# Patient Record
Sex: Female | Born: 1995 | Race: Black or African American | Hispanic: No | Marital: Married | Smoking: Former smoker
Health system: Southern US, Community
[De-identification: ages and names within clinical notes are randomized; demographics above are authoritative.]

## PROBLEM LIST (undated history)

## (undated) ENCOUNTER — Inpatient Hospital Stay (HOSPITAL_COMMUNITY): Payer: BC Managed Care – PPO

## (undated) DIAGNOSIS — N83209 Unspecified ovarian cyst, unspecified side: Secondary | ICD-10-CM

## (undated) DIAGNOSIS — N809 Endometriosis, unspecified: Secondary | ICD-10-CM

## (undated) DIAGNOSIS — J309 Allergic rhinitis, unspecified: Secondary | ICD-10-CM

## (undated) DIAGNOSIS — L309 Dermatitis, unspecified: Secondary | ICD-10-CM

## (undated) HISTORY — DX: Dermatitis, unspecified: L30.9

## (undated) HISTORY — DX: Allergic rhinitis, unspecified: J30.9

## (undated) HISTORY — DX: Unspecified ovarian cyst, unspecified side: N83.209

## (undated) HISTORY — PX: WISDOM TOOTH EXTRACTION: SHX21

---
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
## 2015-05-08 ENCOUNTER — Other Ambulatory Visit: Payer: Self-pay | Admitting: Obstetrics and Gynecology

## 2015-05-08 DIAGNOSIS — R1031 Right lower quadrant pain: Secondary | ICD-10-CM

## 2015-05-13 ENCOUNTER — Other Ambulatory Visit: Payer: Self-pay

## 2015-05-18 ENCOUNTER — Encounter (HOSPITAL_BASED_OUTPATIENT_CLINIC_OR_DEPARTMENT_OTHER): Payer: Self-pay

## 2015-05-18 ENCOUNTER — Emergency Department (HOSPITAL_BASED_OUTPATIENT_CLINIC_OR_DEPARTMENT_OTHER): Payer: BLUE CROSS/BLUE SHIELD

## 2015-05-18 ENCOUNTER — Emergency Department (HOSPITAL_BASED_OUTPATIENT_CLINIC_OR_DEPARTMENT_OTHER)
Admission: EM | Admit: 2015-05-18 | Discharge: 2015-05-18 | Disposition: A | Discharge status: Home / Self Care | Payer: BLUE CROSS/BLUE SHIELD | Attending: Emergency Medicine | Admitting: Emergency Medicine

## 2015-05-18 DIAGNOSIS — Z3202 Encounter for pregnancy test, result negative: Secondary | ICD-10-CM | POA: Insufficient documentation

## 2015-05-18 DIAGNOSIS — Z8742 Personal history of other diseases of the female genital tract: Secondary | ICD-10-CM | POA: Insufficient documentation

## 2015-05-18 DIAGNOSIS — R1031 Right lower quadrant pain: Secondary | ICD-10-CM | POA: Insufficient documentation

## 2015-05-18 DIAGNOSIS — R112 Nausea with vomiting, unspecified: Secondary | ICD-10-CM | POA: Diagnosis not present

## 2015-05-18 HISTORY — DX: Endometriosis, unspecified: N80.9

## 2015-05-18 LAB — CBG MONITORING, ED: GLUCOSE-CAPILLARY: 81 mg/dL (ref 65–99)

## 2015-05-18 LAB — COMPREHENSIVE METABOLIC PANEL
ALT: 15 U/L (ref 14–54)
AST: 21 U/L (ref 15–41)
Albumin: 3.9 g/dL (ref 3.5–5.0)
Alkaline Phosphatase: 62 U/L (ref 38–126)
Anion gap: 6 (ref 5–15)
BILIRUBIN TOTAL: 0.7 mg/dL (ref 0.3–1.2)
BUN: 13 mg/dL (ref 6–20)
CO2: 24 mmol/L (ref 22–32)
CREATININE: 0.82 mg/dL (ref 0.44–1.00)
Calcium: 9 mg/dL (ref 8.9–10.3)
Chloride: 107 mmol/L (ref 101–111)
GFR calc Af Amer: >60 mL/min (ref >60)
GFR calc non Af Amer: >60 mL/min (ref >60)
GLUCOSE: 92 mg/dL (ref 65–99)
POTASSIUM: 3.5 mmol/L (ref 3.5–5.1)
SODIUM: 137 mmol/L (ref 135–145)
TOTAL PROTEIN: 7.5 g/dL (ref 6.5–8.1)

## 2015-05-18 LAB — CBC WITH DIFFERENTIAL/PLATELET
BASOS PCT: 0 % (ref 0–1)
Basophils Absolute: 0 10*3/uL (ref 0.0–0.1)
EOS ABS: 0.1 10*3/uL (ref 0.0–0.7)
EOS PCT: 1 % (ref 0–5)
HCT: 39.3 % (ref 36.0–46.0)
HEMOGLOBIN: 13 g/dL (ref 12.0–15.0)
LYMPHS ABS: 2 10*3/uL (ref 0.7–4.0)
Lymphocytes Relative: 36 % (ref 12–46)
MCH: 27.8 pg (ref 26.0–34.0)
MCHC: 33.1 g/dL (ref 30.0–36.0)
MCV: 84.2 fL (ref 78.0–100.0)
Monocytes Absolute: 0.4 10*3/uL (ref 0.1–1.0)
Monocytes Relative: 8 % (ref 3–12)
NEUTROS ABS: 3 10*3/uL (ref 1.7–7.7)
NEUTROS PCT: 55 % (ref 43–77)
PLATELETS: 207 10*3/uL (ref 150–400)
RBC: 4.67 MIL/uL (ref 3.87–5.11)
RDW: 12.7 % (ref 11.5–15.5)
WBC: 5.5 10*3/uL (ref 4.0–10.5)

## 2015-05-18 LAB — URINALYSIS, ROUTINE W REFLEX MICROSCOPIC
BILIRUBIN URINE: NEGATIVE
Glucose, UA: NEGATIVE mg/dL
HGB URINE DIPSTICK: NEGATIVE
Ketones, ur: NEGATIVE mg/dL
Leukocytes, UA: NEGATIVE
NITRITE: NEGATIVE
Protein, ur: NEGATIVE mg/dL
Specific Gravity, Urine: 1.029 (ref 1.005–1.030)
Urobilinogen, UA: 0.2 mg/dL (ref 0.0–1.0)
pH: 6 (ref 5.0–8.0)

## 2015-05-18 LAB — PREGNANCY, URINE: PREG TEST UR: NEGATIVE

## 2015-05-18 LAB — LIPASE, BLOOD: Lipase: 14 U/L — ABNORMAL LOW (ref 22–51)

## 2015-05-18 MED ORDER — IOHEXOL 300 MG/ML  SOLN
100.0000 mL | Freq: Once | INTRAMUSCULAR | Status: AC | PRN
  Administered 2015-05-18: 100 mL via INTRAVENOUS

## 2015-05-18 MED ORDER — SODIUM CHLORIDE 0.9 % IV SOLN
1000.0000 mL | Freq: Once | INTRAVENOUS | Status: AC
  Administered 2015-05-18: 1000 mL via INTRAVENOUS

## 2015-05-18 MED ORDER — ONDANSETRON 4 MG PO TBDP: 4.0000 mg | ORAL_TABLET | ORAL | Status: DC | PRN

## 2015-05-18 MED ORDER — OMEPRAZOLE 20 MG PO CPDR: 20.0000 mg | DELAYED_RELEASE_CAPSULE | Freq: Every day | ORAL | Status: DC

## 2015-05-18 MED ORDER — IOHEXOL 300 MG/ML  SOLN
50.0000 mL | Freq: Once | INTRAMUSCULAR | Status: AC | PRN
Start: 2015-05-18 — End: 2015-05-18
  Administered 2015-05-18: 50 mL via ORAL

## 2015-05-18 MED ORDER — SODIUM CHLORIDE 0.9 % IV SOLN: 1000.0000 mL | INTRAVENOUS | Status: DC

## 2015-05-18 NOTE — ED Provider Notes (Signed)
CSN: [id number]     Arrival date & time 05/18/15  1035 History   First MD Initiated Contact with Patient 05/18/15 1134     Chief Complaint  Patient presents with  . Abdominal Pain     (Consider location/radiation/quality/duration/timing/severity/associated sxs/prior Treatment) HPI The patient reports for about 3 weeks she's been getting intermittent and severe episodes of right lower quadrant pain. It will be an intense aching in the right lower quadrant. Sometimes she awakens with vomiting preceding the pain. She reports that the pain is worse with movements. Since its onset about 3 weeks ago, there are periods when it improves but then it always seems to resume again. The patient has been seen by her gynecologist and a pelvic examination and ultrasound were done. Reportedly she was diagnosed with a dermoid cyst which they felt was not contributory. She denies any pain in the flank. She does state however the pain seems to be moving somewhat around into her lower back now. She denies abnormal vaginal bleeding or discharge. No pain or burning with urination. The patient states that close to the time of onset, she thought she was constipated so tried a laxative. That evening she developed vomiting and diarrhea. Since that time the pain has been persistently waxing and waning.  Past Medical History  Diagnosis Date  . Endometriosis    History reviewed. No pertinent past surgical history. No family history on file. History  Substance Use Topics  . Smoking status: Never Smoker   . Smokeless tobacco: Not on file  . Alcohol Use: Not on file   OB History    No data available     Review of Systems  10 Systems reviewed and are negative for acute change except as noted in the HPI.   Allergies  Hydrocodone  Home Medications   Prior to Admission medications   Medication Sig Start Date End Date Taking? Authorizing Provider  ibuprofen (ADVIL,MOTRIN) 800 MG tablet Take 800 mg by mouth every 8  (eight) hours as needed.   Yes Historical Provider, MD  oxycodone-acetaminophen (PERCOCET) 2.5-325 MG per tablet Take 1 tablet by mouth every 8 (eight) hours as needed for pain.   Yes Historical Provider, MD  omeprazole (PRILOSEC) 20 MG capsule Take 1 capsule (20 mg total) by mouth daily. 05/18/15   Charlesetta Shanks, MD  ondansetron (ZOFRAN ODT) 4 MG disintegrating tablet Take 1 tablet (4 mg total) by mouth every 4 (four) hours as needed for nausea or vomiting. 05/18/15   Charlesetta Shanks, MD   BP 130/66 mmHg  Pulse 89  Temp(Src) 98.3 F (36.8 C) (Oral)  Resp 18  Ht 5\' 1"  (1.549 m)  Wt 198 lb (89.812 kg)  BMI 37.43 kg/m2  SpO2 100% Physical Exam  Constitutional: She is oriented to person, place, and time. She appears well-developed and well-nourished.  HENT:  Head: Normocephalic and atraumatic.  Eyes: EOM are normal. Pupils are equal, round, and reactive to light.  Neck: Neck supple.  Cardiovascular: Normal rate, regular rhythm, normal heart sounds and intact distal pulses.   Pulmonary/Chest: Effort normal and breath sounds normal.  Abdominal: Soft. Bowel sounds are normal. She exhibits no distension. There is tenderness.  Right lower quadrant moderate to severe tenderness. No CVA tenderness.  Musculoskeletal: Normal range of motion. She exhibits no edema.  Neurological: She is alert and oriented to person, place, and time. She has normal strength. Coordination normal. GCS eye subscore is 4. GCS verbal subscore is 5. GCS motor subscore is 6.  Skin: Skin is warm, dry and intact.  Psychiatric: She has a normal mood and affect.    ED Course  Procedures (including critical care time) Labs Review Labs Reviewed  LIPASE, BLOOD - Abnormal; Notable for the following:    Lipase 14 (*)    All other components within normal limits  URINALYSIS, ROUTINE W REFLEX MICROSCOPIC (NOT AT [facility])  PREGNANCY, URINE  COMPREHENSIVE METABOLIC PANEL  CBC WITH DIFFERENTIAL/PLATELET  CBG MONITORING, ED     Imaging Review Ct Abdomen Pelvis W Contrast  05/18/2015   CLINICAL DATA:  Right lower quadrant pain, nausea and vomiting, history of endometriosis and intrauterine device, negative urine pregnancy test  EXAM: CT ABDOMEN AND PELVIS WITH CONTRAST  TECHNIQUE: Multidetector CT imaging of the abdomen and pelvis was performed using the standard protocol following bolus administration of intravenous contrast.  CONTRAST:  56mL OMNIPAQUE IOHEXOL 300 MG/ML SOLN, 13mL OMNIPAQUE IOHEXOL 300 MG/ML SOLN  COMPARISON:  None.  FINDINGS: Lower chest:  Clear  Hepatobiliary: Negative  Spleen: Negative  Pancreas: Negative  Stomach/Bowel: Stomach, small bowel, appendix, and large bowel are normal in appearance.  Urinary tract: Normal  Vascular/Lymphatic: Normal  Reproductive: Intrauterine device noted. Left adnexa demonstrates an incidental 15 mm cyst likely involving the left ovary. In the right adnexa there is a 27 mm cyst likely involving the ovary. There is also another complex nodule measuring 12 mm in the right adnexa which shows both soft tissue and fat attenuation. There is a small volume of free fluid in the right adnexa extending into the cul-de-sac.  Musculoskeletal: Negative  Other: No other significant findings  IMPRESSION: Findings suggest probable rupture of right ovarian cyst with associated free fluid. Additionally, there may be a right ovarian dermoid. Ultrasound of the pelvis is recommended to further characterize the right ovary.   Electronically Signed   By: Skipper Cliche M.D.   On: 05/18/2015 14:19     EKG Interpretation None      MDM   Final diagnoses:  Non-intractable vomiting with nausea, vomiting of unspecified type  Right lower quadrant abdominal pain   Patient has had recurrent and intermittent right lower quadrant pain. Again yesterday evening she had a severe episode and vomiting associated. At this point time CT does not show any process such as appendicitis, inflammatory colitis or  obstruction. No frank gallstones or evident cholecystitis is identified. Identified is a likely right ovarian cyst and the dermoid cyst that had previously been identified by her GYN by ultrasound. At this time the patient has had improvement of pain. Due to recurrences of nausea and vomiting, I will initiate the patient on a PPI for a planned course of 2 weeks with Zofran to use as needed. Regarding her episodic pain, this may be ovarian in nature although per her report her GYN thought that was unlikely. I did however recommend the patient she will need GYN follow-up as well as follow-up with her family doctor to monitor for possibility of biliary colic. Patient is counseled on dietary changes while she continues to have evaluation and await response to treatment for her ongoing, episodic abdominal pain and vomiting since approximately a month ago.    Charlesetta Shanks, MD 05/19/15 [phone]

## 2015-05-18 NOTE — ED Notes (Signed)
Patient complains of lower abdominal pain that she reports more localized in RLQ. Reports that the symptoms lat maybe 24-36 hours than resolves. Pain worse with ambulation. Saw her GYN 3 weeks ago and IUD ok and no UTI. Taking phenergan and pain meds with some relief. Reports that the pain returned early am with vomiting, worse with ambulation

## 2015-05-18 NOTE — Discharge Instructions (Signed)
Abdominal Pain, Women °Abdominal (stomach, pelvic, or belly) pain can be caused by many things. It is important to tell your doctor: °· The location of the pain. °· Does it come and go or is it present all the time? °· Are there things that start the pain (eating certain foods, exercise)? °· Are there other symptoms associated with the pain (fever, nausea, vomiting, diarrhea)? °All of this is helpful to know when trying to find the cause of the pain. °CAUSES  °· Stomach: virus or bacteria infection, or ulcer. °· Intestine: appendicitis (inflamed appendix), regional ileitis (Crohn's disease), ulcerative colitis (inflamed colon), irritable bowel syndrome, diverticulitis (inflamed diverticulum of the colon), or cancer of the stomach or intestine. °· Gallbladder disease or stones in the gallbladder. °· Kidney disease, kidney stones, or infection. °· Pancreas infection or cancer. °· Fibromyalgia (pain disorder). °· Diseases of the female organs: °· Uterus: fibroid (non-cancerous) tumors or infection. °· Fallopian tubes: infection or tubal pregnancy. °· Ovary: cysts or tumors. °· Pelvic adhesions (scar tissue). °· Endometriosis (uterus lining tissue growing in the pelvis and on the pelvic organs). °· Pelvic congestion syndrome (female organs filling up with blood just before the menstrual period). °· Pain with the menstrual period. °· Pain with ovulation (producing an egg). °· Pain with an IUD (intrauterine device, birth control) in the uterus. °· Cancer of the female organs. °· Functional pain (pain not caused by a disease, may improve without treatment). °· Psychological pain. °· Depression. °DIAGNOSIS  °Your doctor will decide the seriousness of your pain by doing an examination. °· Blood tests. °· X-rays. °· Ultrasound. °· CT scan (computed tomography, special type of X-ray). °· MRI (magnetic resonance imaging). °· Cultures, for infection. °· Barium enema (dye inserted in the large intestine, to better view it with  X-rays). °· Colonoscopy (looking in intestine with a lighted tube). °· Laparoscopy (minor surgery, looking in abdomen with a lighted tube). °· Major abdominal exploratory surgery (looking in abdomen with a large incision). °TREATMENT  °The treatment will depend on the cause of the pain.  °· Many cases can be observed and treated at home. °· Over-the-counter medicines recommended by your caregiver. °· Prescription medicine. °· Antibiotics, for infection. °· Birth control pills, for painful periods or for ovulation pain. °· Hormone treatment, for endometriosis. °· Nerve blocking injections. °· Physical therapy. °· Antidepressants. °· Counseling with a psychologist or psychiatrist. °· Minor or major surgery. °HOME CARE INSTRUCTIONS  °· Do not take laxatives, unless directed by your caregiver. °· Take over-the-counter pain medicine only if ordered by your caregiver. Do not take aspirin because it can cause an upset stomach or bleeding. °· Try a clear liquid diet (broth or water) as ordered by your caregiver. Slowly move to a bland diet, as tolerated, if the pain is related to the stomach or intestine. °· Have a thermometer and take your temperature several times a day, and record it. °· Bed rest and sleep, if it helps the pain. °· Avoid sexual intercourse, if it causes pain. °· Avoid stressful situations. °· Keep your follow-up appointments and tests, as your caregiver orders. °· If the pain does not go away with medicine or surgery, you may try: °· Acupuncture. °· Relaxation exercises (yoga, meditation). °· Group therapy. °· Counseling. °SEEK MEDICAL CARE IF:  °· You notice certain foods cause stomach pain. °· Your home care treatment is not helping your pain. °· You need stronger pain medicine. °· You want your IUD removed. °· You feel faint or   not go away with medicine or surgery, you may try:   Acupuncture.   Relaxation exercises (yoga, meditation).   Group therapy.   Counseling.  SEEK MEDICAL CARE IF:    You notice certain foods cause stomach pain.   Your home care treatment is not helping your pain.   You need stronger pain medicine.   You want your IUD removed.   You feel faint or lightheaded.   You develop nausea and vomiting.   You develop a rash.   You are having side effects or an allergy to your medicine.  SEEK IMMEDIATE MEDICAL CARE IF:    Your  pain does not go away or gets worse.   You have a fever.   Your pain is felt only in portions of the abdomen. The right side could possibly be appendicitis. The left lower portion of the abdomen could be colitis or diverticulitis.   You are passing blood in your stools (bright red or black tarry stools, with or without vomiting).   You have blood in your urine.   You develop chills, with or without a fever.   You pass out.  MAKE SURE YOU:    Understand these instructions.   Will watch your condition.   Will get help right away if you are not doing well or get worse.  Document Released: 09/06/2007 Document Revised: 03/26/2014 Document Reviewed: 09/26/2009  [employer] Patient Information 2015 [employer]. This information is not intended to replace advice given to you by your health care provider. Make sure you discuss any questions you have with your health care provider.          Ovarian Cyst  An ovarian cyst is a fluid-filled sac that forms on an ovary. The ovaries are small organs that produce eggs in women. Various types of cysts can form on the ovaries. Most are not cancerous. Many do not cause problems, and they often go away on their own. Some may cause symptoms and require treatment. Common types of ovarian cysts include:   Functional cysts--These cysts may occur every month during the menstrual cycle. This is normal. The cysts usually go away with the next menstrual cycle if the woman does not get pregnant. Usually, there are no symptoms with a functional cyst.   Endometrioma cysts--These cysts form from the tissue that lines the uterus. They are also called "chocolate cysts" because they become filled with blood that turns brown. This type of cyst can cause pain in the lower abdomen during intercourse and with your menstrual period.   Cystadenoma cysts--This type develops from the cells on the outside of the ovary. These cysts can get very big and cause lower abdomen pain and pain with  intercourse. This type of cyst can twist on itself, cut off its blood supply, and cause severe pain. It can also easily rupture and cause a lot of pain.   Dermoid cysts--This type of cyst is sometimes found in both ovaries. These cysts may contain different kinds of body tissue, such as skin, teeth, hair, or cartilage. They usually do not cause symptoms unless they get very big.   Theca lutein cysts--These cysts occur when too much of a certain hormone (human chorionic gonadotropin) is produced and overstimulates the ovaries to produce an egg. This is most common after procedures used to assist with the conception of a baby (in vitro fertilization).  CAUSES    Fertility drugs can cause a condition in which multiple large cysts are formed on the   about the cyst. These tests may include:  Ultrasound.  X-ray of the pelvis.  CT scan.  MRI.  Blood tests. TREATMENT  Many ovarian cysts go away on their own without treatment. Your health care provider may want to check your cyst regularly for 2-3 months to see if it changes. For women in menopause, it is particularly important to monitor a cyst closely because of the higher rate of ovarian cancer in menopausal women. When treatment is needed, it may include any of the following:  A procedure to  drain the cyst (aspiration). This may be done using a long needle and ultrasound. It can also be done through a laparoscopic procedure. This involves using a thin, lighted tube with a tiny camera on the end (laparoscope) inserted through a small incision.  Surgery to remove the whole cyst. This may be done using laparoscopic surgery or an open surgery involving a larger incision in the lower abdomen.  Hormone treatment or birth control pills. These methods are sometimes used to help dissolve a cyst. HOME CARE INSTRUCTIONS   Only take over-the-counter or prescription medicines as directed by your health care provider.  Follow up with your health care provider as directed.  Get regular pelvic exams and Pap tests. SEEK MEDICAL CARE IF:   Your periods are late, irregular, or painful, or they stop.  Your pelvic pain or abdominal pain does not go away.  Your abdomen becomes larger or swollen.  You have pressure on your bladder or trouble emptying your bladder completely.  You have pain during sexual intercourse.  You have feelings of fullness, pressure, or discomfort in your stomach.  You lose weight for no apparent reason.  You feel generally ill.  You become constipated.  You lose your appetite.  You develop acne.  You have an increase in body and facial hair.  You are gaining weight, without changing your exercise and eating habits.  You think you are pregnant. SEEK IMMEDIATE MEDICAL CARE IF:   You have increasing abdominal pain.  You feel sick to your stomach (nauseous), and you throw up (vomit).  You develop a fever that comes on suddenly.  You have abdominal pain during a bowel movement.  Your menstrual periods become heavier than usual. MAKE SURE YOU:  Understand these instructions.  Will watch your condition.  Will get help right away if you are not doing well or get worse. Document Released: 11/09/2005 Document Revised: 11/14/2013 Document Reviewed:  07/17/2013 [facility] Patient Information 2015 [place], [place]. This information is not intended to replace advice given to you by your health care provider. Make sure you discuss any questions you have with your health care provider. Nausea and Vomiting Nausea is a sick feeling that often comes before throwing up (vomiting). Vomiting is a reflex where stomach contents come out of your mouth. Vomiting can cause severe loss of body fluids (dehydration). Children and elderly adults can become dehydrated quickly, especially if they also have diarrhea. Nausea and vomiting are symptoms of a condition or disease. It is important to find the cause of your symptoms. CAUSES   Direct irritation of the stomach lining. This irritation can result from increased acid production (gastroesophageal reflux disease), infection, food poisoning, taking certain medicines (such as nonsteroidal anti-inflammatory drugs), alcohol use, or tobacco use.  Signals from the brain.These signals could be caused by a headache, heat exposure, an inner ear disturbance, increased pressure in the brain from injury, infection, a tumor, or a concussion, pain, emotional stimulus, or metabolic  problems.  An obstruction in the gastrointestinal tract (bowel obstruction).  Illnesses such as diabetes, hepatitis, gallbladder problems, appendicitis, kidney problems, cancer, sepsis, atypical symptoms of a heart attack, or eating disorders.  Medical treatments such as chemotherapy and radiation.  Receiving medicine that makes you sleep (general anesthetic) during surgery. DIAGNOSIS Your caregiver may ask for tests to be done if the problems do not improve after a few days. Tests may also be done if symptoms are severe or if the reason for the nausea and vomiting is not clear. Tests may include:  Urine tests.  Blood tests.  Stool tests.  Cultures (to look for evidence of infection).  X-rays or other imaging studies. Test results can help  your caregiver make decisions about treatment or the need for additional tests. TREATMENT You need to stay well hydrated. Drink frequently but in small amounts.You may wish to drink water, sports drinks, clear broth, or eat frozen ice pops or gelatin dessert to help stay hydrated.When you eat, eating slowly may help prevent nausea.There are also some antinausea medicines that may help prevent nausea. HOME CARE INSTRUCTIONS   Take all medicine as directed by your caregiver.  If you do not have an appetite, do not force yourself to eat. However, you must continue to drink fluids.  If you have an appetite, eat a normal diet unless your caregiver tells you differently.  Eat a variety of complex carbohydrates (rice, wheat, potatoes, bread), lean meats, yogurt, fruits, and vegetables.  Avoid high-fat foods because they are more difficult to digest.  Drink enough water and fluids to keep your urine clear or pale yellow.  If you are dehydrated, ask your caregiver for specific rehydration instructions. Signs of dehydration may include:  Severe thirst.  Dry lips and mouth.  Dizziness.  Dark urine.  Decreasing urine frequency and amount.  Confusion.  Rapid breathing or pulse. SEEK IMMEDIATE MEDICAL CARE IF:   You have blood or brown flecks (like coffee grounds) in your vomit.  You have black or bloody stools.  You have a severe headache or stiff neck.  You are confused.  You have severe abdominal pain.  You have chest pain or trouble breathing.  You do not urinate at least once every 8 hours.  You develop cold or clammy skin.  You continue to vomit for longer than 24 to 48 hours.  You have a fever. MAKE SURE YOU:   Understand these instructions.  Will watch your condition.  Will get help right away if you are not doing well or get worse. Document Released: 11/09/2005 Document Revised: 02/01/2012 Document Reviewed: 04/08/2011 [facility] Patient Information 2015  [place], [place]. This information is not intended to replace advice given to you by your health care provider. Make sure you discuss any questions you have with your health care provider. Gastroesophageal Reflux Disease, Adult Gastroesophageal reflux disease (GERD) happens when acid from your stomach flows up into the esophagus. When acid comes in contact with the esophagus, the acid causes soreness (inflammation) in the esophagus. Over time, GERD may create small holes (ulcers) in the lining of the esophagus. CAUSES   Increased body weight. This puts pressure on the stomach, making acid rise from the stomach into the esophagus.  Smoking. This increases acid production in the stomach.  Drinking alcohol. This causes decreased pressure in the lower esophageal sphincter (valve or ring of muscle between the esophagus and stomach), allowing acid from the stomach into the esophagus.  Late evening meals and a full stomach.  This increases pressure and acid production in the stomach.  A malformed lower esophageal sphincter. Sometimes, no cause is found. SYMPTOMS   Burning pain in the lower part of the mid-chest behind the breastbone and in the mid-stomach area. This may occur twice a week or more often.  Trouble swallowing.  Sore throat.  Dry cough.  Asthma-like symptoms including chest tightness, shortness of breath, or wheezing. DIAGNOSIS  Your caregiver may be able to diagnose GERD based on your symptoms. In some cases, X-rays and other tests may be done to check for complications or to check the condition of your stomach and esophagus. TREATMENT  Your caregiver may recommend over-the-counter or prescription medicines to help decrease acid production. Ask your caregiver before starting or adding any new medicines.  HOME CARE INSTRUCTIONS   Change the factors that you can control. Ask your caregiver for guidance concerning weight loss, quitting smoking, and alcohol consumption.  Avoid foods  and drinks that make your symptoms worse, such as:  Caffeine or alcoholic drinks.  Chocolate.  Peppermint or mint flavorings.  Garlic and onions.  Spicy foods.  Citrus fruits, such as oranges, lemons, or limes.  Tomato-based foods such as sauce, chili, salsa, and pizza.  Fried and fatty foods.  Avoid lying down for the 3 hours prior to your bedtime or prior to taking a nap.  Eat small, frequent meals instead of large meals.  Wear loose-fitting clothing. Do not wear anything tight around your waist that causes pressure on your stomach.  Raise the head of your bed 6 to 8 inches with wood blocks to help you sleep. Extra pillows will not help.  Only take over-the-counter or prescription medicines for pain, discomfort, or fever as directed by your caregiver.  Do not take aspirin, ibuprofen, or other nonsteroidal anti-inflammatory drugs (NSAIDs). SEEK IMMEDIATE MEDICAL CARE IF:   You have pain in your arms, neck, jaw, teeth, or back.  Your pain increases or changes in intensity or duration.  You develop nausea, vomiting, or sweating (diaphoresis).  You develop shortness of breath, or you faint.  Your vomit is green, yellow, black, or looks like coffee grounds or blood.  Your stool is red, bloody, or black. These symptoms could be signs of other problems, such as heart disease, gastric bleeding, or esophageal bleeding. MAKE SURE YOU:   Understand these instructions.  Will watch your condition.  Will get help right away if you are not doing well or get worse. Document Released: 08/19/2005 Document Revised: 02/01/2012 Document Reviewed: 05/29/2011 [facility] Patient Information 2015 [place], [place]. This information is not intended to replace advice given to you by your health care provider. Make sure you discuss any questions you have with your health care provider.

## 2015-05-24 ENCOUNTER — Encounter: Payer: Self-pay | Admitting: Physician Assistant

## 2015-06-07 ENCOUNTER — Ambulatory Visit: Payer: BLUE CROSS/BLUE SHIELD | Admitting: Physician Assistant

## 2015-06-10 ENCOUNTER — Encounter: Payer: Self-pay | Admitting: *Deleted

## 2015-06-14 ENCOUNTER — Other Ambulatory Visit (INDEPENDENT_AMBULATORY_CARE_PROVIDER_SITE_OTHER): Payer: BLUE CROSS/BLUE SHIELD

## 2015-06-14 ENCOUNTER — Ambulatory Visit (INDEPENDENT_AMBULATORY_CARE_PROVIDER_SITE_OTHER): Payer: BLUE CROSS/BLUE SHIELD | Admitting: Physician Assistant

## 2015-06-14 ENCOUNTER — Encounter: Payer: Self-pay | Admitting: Physician Assistant

## 2015-06-14 VITALS — BP 100/62 | HR 104 | Temp 98.1°F | Ht 62.0 in | Wt 197.0 lb

## 2015-06-14 DIAGNOSIS — R1013 Epigastric pain: Secondary | ICD-10-CM | POA: Diagnosis not present

## 2015-06-14 DIAGNOSIS — K5900 Constipation, unspecified: Secondary | ICD-10-CM | POA: Diagnosis not present

## 2015-06-14 DIAGNOSIS — R110 Nausea: Secondary | ICD-10-CM

## 2015-06-14 DIAGNOSIS — R1011 Right upper quadrant pain: Secondary | ICD-10-CM

## 2015-06-14 LAB — CBC WITH DIFFERENTIAL/PLATELET
Basophils Absolute: 0 10*3/uL (ref 0.0–0.1)
Basophils Relative: 0.5 % (ref 0.0–3.0)
Eosinophils Absolute: 0.1 10*3/uL (ref 0.0–0.7)
Eosinophils Relative: 1.1 % (ref 0.0–5.0)
HEMATOCRIT: 40.5 % (ref 36.0–49.0)
HEMOGLOBIN: 13.4 g/dL (ref 12.0–16.0)
LYMPHS PCT: 22.6 % — AB (ref 24.0–48.0)
Lymphs Abs: 2.1 10*3/uL (ref 0.7–4.0)
MCHC: 33.2 g/dL (ref 31.0–37.0)
MCV: 83.8 fl (ref 78.0–98.0)
Monocytes Absolute: 0.6 10*3/uL (ref 0.1–1.0)
Monocytes Relative: 6.9 % (ref 3.0–12.0)
Neutro Abs: 6.2 10*3/uL (ref 1.4–7.7)
Neutrophils Relative %: 68.9 % (ref 43.0–71.0)
Platelets: 233 10*3/uL (ref 150.0–575.0)
RBC: 4.84 Mil/uL (ref 3.80–5.70)
RDW: 13.2 % (ref 11.4–15.5)
WBC: 9.1 10*3/uL (ref 4.5–13.5)

## 2015-06-14 LAB — AMYLASE: Amylase: 43 U/L (ref 27–131)

## 2015-06-14 LAB — HEPATIC FUNCTION PANEL
ALT: 12 U/L (ref 0–35)
AST: 15 U/L (ref 0–37)
Albumin: 4.4 g/dL (ref 3.5–5.2)
Alkaline Phosphatase: 66 U/L (ref 47–119)
Bilirubin, Direct: 0.1 mg/dL (ref 0.0–0.3)
Total Bilirubin: 0.5 mg/dL (ref 0.2–1.2)
Total Protein: 7.6 g/dL (ref 6.0–8.3)

## 2015-06-14 LAB — LIPASE: Lipase: 13 U/L (ref 11.0–59.0)

## 2015-06-14 LAB — IGA: IgA: 105 mg/dL (ref 68–378)

## 2015-06-14 MED ORDER — PANTOPRAZOLE SODIUM 40 MG PO TBEC: DELAYED_RELEASE_TABLET | ORAL | Status: DC

## 2015-06-14 MED ORDER — DICYCLOMINE HCL 20 MG PO TABS: 20.0000 mg | ORAL_TABLET | Freq: Three times a day (TID) | ORAL | Status: DC | PRN

## 2015-06-14 NOTE — Progress Notes (Signed)
Patient ID: Charlene Reynolds, female   DOB: 23-Sep-1996, 19 y.o.   MRN: [id number]    HPI:  Charlene Reynolds is a 19 y.o.   female referred by Judithann Sauger, MD for evaluation of abdominal pain and nausea. Patient has a prior history of endometriosis and ovarian cysts.  Shahidah states she has been having intermittent abdominal pain with nausea and vomiting since May. She reports that she has episodes of epigastric and right-sided abdominal pain that come and go. Initially she thought it was due to a new IUD and she was evaluated by her gynecologist and told it was not a GYN problem. She was then seen in the emergency room and had an abdominal and pelvic CT that did not show an acute process but she was noted to have a right ovarian cyst and a dermoid cyst. Laboratory studies were normal. Due to her complaints of nausea she was prescribed a PPI but she never filled the prescription. She presents today stating that she has had epigastric and right upper quadrant pain associated with nausea. Her nausea and pain typically occur postprandially. If she eats a lot she develops epigastric pain, burping, and acid reflux. Initially she vomited several times but has not vomited in several weeks. She has no dysphagia but states she feels full sooner than normal and feels as if there is a brick in her stomach. She has used promethazine with varying results. She normally has bowel movement every other day but last week had diarrhea after eating some fried food and this week she has not had a bowel movement in 5 days. She has had no bright red blood per rectum or melena. She has pain across the lower abdomen that gets worse the longer she goes without a bowel movement. She reports that lately if she eats anything rich she will develop nausea and an ache in the epigastric area and right upper quadrant area but she has no radiation of the pain to her back. She states she had an aunt who had gallstones and developed pancreatitis and she  has had a cousin or 2 with gallbladder disease. She states her mother has IBS but she is unaware of her family history of colon cancer, colon polyps, or inflammatory bowel disease.   Past Medical History  Diagnosis Date  . Endometriosis   . Allergic rhinitis   . Eczema   . Ovarian cyst bilateral    Past Surgical History  Procedure Laterality Date  . Wisdom tooth extraction     Family History  Problem Relation Age of Onset  . Asthma Mother   . Hypertension Mother   . Diabetes Paternal Grandfather   . Diabetes Paternal Grandmother   . Irritable bowel syndrome Mother   . Breast cancer Maternal Aunt    History  Substance Use Topics  . Smoking status: Former Smoker    Types: Cigars  . Smokeless tobacco: Never Used  . Alcohol Use: No   Current Outpatient Prescriptions  Medication Sig Dispense Refill  . ibuprofen (ADVIL,MOTRIN) 800 MG tablet Take 800 mg by mouth every 8 (eight) hours as needed.    . promethazine (PHENERGAN) 25 MG tablet Take 25 mg by mouth 1 day or 1 dose.    . dicyclomine (BENTYL) 20 MG tablet Take 1 tablet (20 mg total) by mouth 3 (three) times daily as needed for spasms. 90 tablet 1  . pantoprazole (PROTONIX) 40 MG tablet Take 1 tablet every morning 30 minutes prior to breakfast. 30 tablet 6  No current facility-administered medications for this visit.   Allergies  Allergen Reactions  . Hydrocodone Nausea And Vomiting  . Soy Allergy Other (See Comments)    Not a true allergy., but flares eczema     Review of Systems: Gen: Denies any fever, chills, sweats, anorexia, fatigue, weakness, malaise, weight loss, and sleep disorder CV: Denies chest pain, angina, palpitations, syncope, orthopnea, PND, peripheral edema, and claudication. Resp: Denies dyspnea at rest, dyspnea with exercise, cough, sputum, wheezing, coughing up blood, and pleurisy. GI: Denies vomiting blood, jaundice, and fecal incontinence.   Denies dysphagia or odynophagia. GU : Denies urinary  burning, blood in urine, urinary frequency, urinary hesitancy, nocturnal urination, and urinary incontinence. MS: Denies joint pain, limitation of movement, and swelling, stiffness, low back pain, extremity pain. Denies muscle weakness, cramps, atrophy.  Derm: Denies rash, itching, dry skin, hives, moles, warts, or unhealing ulcers.  Psych: Denies depression, anxiety, memory loss, suicidal ideation, hallucinations, paranoia, and confusion. Heme: Denies bruising, bleeding, and enlarged lymph nodes. Neuro:  Denies any headaches, dizziness, paresthesias. Endo:  Denies any problems with DM, thyroid, adrenal function  Studies: Ct Abdomen Pelvis W Contrast  05/18/2015   CLINICAL DATA:  Right lower quadrant pain, nausea and vomiting, history of endometriosis and intrauterine device, negative urine pregnancy test  EXAM: CT ABDOMEN AND PELVIS WITH CONTRAST  TECHNIQUE: Multidetector CT imaging of the abdomen and pelvis was performed using the standard protocol following bolus administration of intravenous contrast.  CONTRAST:  36mL OMNIPAQUE IOHEXOL 300 MG/ML SOLN, 139mL OMNIPAQUE IOHEXOL 300 MG/ML SOLN  COMPARISON:  None.  FINDINGS: Lower chest:  Clear  Hepatobiliary: Negative  Spleen: Negative  Pancreas: Negative  Stomach/Bowel: Stomach, small bowel, appendix, and large bowel are normal in appearance.  Urinary tract: Normal  Vascular/Lymphatic: Normal  Reproductive: Intrauterine device noted. Left adnexa demonstrates an incidental 15 mm cyst likely involving the left ovary. In the right adnexa there is a 27 mm cyst likely involving the ovary. There is also another complex nodule measuring 12 mm in the right adnexa which shows both soft tissue and fat attenuation. There is a small volume of free fluid in the right adnexa extending into the cul-de-sac.  Musculoskeletal: Negative  Other: No other significant findings  IMPRESSION: Findings suggest probable rupture of right ovarian cyst with associated free fluid.  Additionally, there may be a right ovarian dermoid. Ultrasound of the pelvis is recommended to further characterize the right ovary.   Electronically Signed   By: Skipper Cliche M.D.   On: 05/18/2015 14:19    LAB RESULTS: CBC on 05/18/2015 white count 5.5, hemoglobin 13, hematocrit 39.3, platelets 207,000. Comprehensive metabolic panel on 19/62/2297 out and 3.9, AST 21, ALT 15, alkaline phosphatase 62, total bili 0.7. Lipase on 05/18/2015 was 14.   Physical Exam: BP 100/62 mmHg  Pulse 104  Temp(Src) 98.1 F (36.7 C)  Ht 5\' 2"  (1.575 m)  Wt 197 lb (89.359 kg)  BMI 36.02 kg/m2 Constitutional: Pleasant,well-developed, African-American female in no acute distress. HEENT: Normocephalic and atraumatic. Conjunctivae are normal. No scleral icterus. Neck supple. Cervical adenopathy Cardiovascular: Normal rate, regular rhythm.  Pulmonary/chest: Effort normal and breath sounds normal. No wheezing, rales or rhonchi. Abdominal: Soft, nondistended, tender epigastric area and right upper quadrant to palpation Bowel sounds active throughout. There are no masses palpable. No hepatomegaly. Extremities: no edema Lymphadenopathy: No cervical adenopathy noted. Neurological: Alert and oriented to person place and time. Skin: Skin is warm and dry. No rashes noted. Psychiatric: Normal mood and affect.  Behavior is normal.  ASSESSMENT AND PLAN: 19 year old female with a 46 week history of postprandial epigastric and right upper quadrant pain associated with acid reflux and nausea, referred for evaluation. Laboratory studies in the emergency room were nonrevealing and CT scan revealed an ovarian cyst. An antireflux regimen has been reviewed with the patient and she will be given a trial of pantoprazole 40 mg 1 by mouth every morning 30 minutes prior to breakfast. A CBC, amylase, lipase, hepatic function panel, will be obtained. She will be scheduled for an EGD to evaluate for esophagitis, gastritis, ulcer,  etc.The risks, benefits, and alternatives to endoscopy with possible biopsy and possible dilation were discussed with the patient and they consent to proceed. The procedure will be scheduled with Dr. Fuller Plan.  With regards to her constipation, she will be given a Mira lax/Gatorade purge today and then use Mira lax one to 2 capfuls daily in 8 ounces of water. Patient has been asked to complete stool cards for occult blood. An IgA and TTG will be obtained. She will also be given a trial of Bentyl 20 mg 1 by mouth 3 times a day when necessary cramps.   Further recommendations will be made pending the findings of the above.    Biviana Saddler, Deloris Ping 06/14/2015, 12:14 PM  CC: Judithann Sauger, MD

## 2015-06-14 NOTE — Progress Notes (Signed)
Reviewed and agree with management plan.  Malcolm T. Stark, MD FACG 

## 2015-06-14 NOTE — Patient Instructions (Addendum)
You have been scheduled for an abdominal ultrasound at [facility] Radiology (1st floor of hospital) on 06/18/2015 at 8:00am. Please arrive 15 minutes prior to your appointment for registration. Make certain not to have anything to eat or drink 6 hours prior to your appointment. Should you need to reschedule your appointment, please contact radiology at [phone]. This test typically takes about 30 minutes to perform.   You have been scheduled for an endoscopy. Please follow written instructions given to you at your visit today. If you use inhalers (even only as needed), please bring them with you on the day of your procedure.  Your physician has requested that you go to the basement for lab work before leaving today.  We have sent medications to your pharmacy for you to pick up at your convenience.  Lori Hvozdovic PZ-C recommends that you complete a bowel purge (to clean out your bowels). Please do the following: Purchase a bottle of Miralax over the counter as well as a box of 5 mg dulcolax tablets. Take 4 dulcolax tablets. Wait 1 hour. You will then drink 6-8 capfuls of Miralax mixed in an adequate amount of water/juice/gatorade (you may choose which of these liquids to drink) over the next 2-3 hours. You should expect results within 1 to 6 hours after completing the bowel purge.

## 2015-06-17 LAB — TISSUE TRANSGLUTAMINASE, IGA: TISSUE TRANSGLUTAMINASE AB, IGA: 1 U/mL (ref <4)

## 2015-06-18 ENCOUNTER — Ambulatory Visit (HOSPITAL_COMMUNITY): Payer: BLUE CROSS/BLUE SHIELD

## 2015-06-26 ENCOUNTER — Telehealth: Payer: Self-pay | Admitting: Physician Assistant

## 2015-06-26 NOTE — Telephone Encounter (Signed)
Spoke with pt and she is aware.

## 2015-06-26 NOTE — Telephone Encounter (Signed)
Per her note, her sx were mainly upper. She hasd been in ER and had CT and has bilat ovarian cuysts--should follow with gyn for that. EWould proc with egd and take it from there.

## 2015-06-26 NOTE — Telephone Encounter (Signed)
Pt states she has been having more problems with lower abdominal pain and she only has the upper abdominal pain and epigastric pain when she goes to the bathroom. Pt was under the impression she was having an EGD and colon. Pt wants to have both procedures done. Please advise.

## 2015-06-27 ENCOUNTER — Encounter: Payer: Self-pay | Admitting: Gastroenterology

## 2015-06-27 ENCOUNTER — Ambulatory Visit (AMBULATORY_SURGERY_CENTER): Payer: BLUE CROSS/BLUE SHIELD | Admitting: Gastroenterology

## 2015-06-27 VITALS — BP 119/68 | HR 79 | Temp 98.0°F | Resp 34 | Ht 62.0 in | Wt 197.0 lb

## 2015-06-27 DIAGNOSIS — R1011 Right upper quadrant pain: Secondary | ICD-10-CM

## 2015-06-27 DIAGNOSIS — R1013 Epigastric pain: Secondary | ICD-10-CM | POA: Diagnosis not present

## 2015-06-27 MED ORDER — SODIUM CHLORIDE 0.9 % IV SOLN: 500.0000 mL | INTRAVENOUS | Status: DC

## 2015-06-27 NOTE — Patient Instructions (Signed)
Discharge instructions given. Normal exam. Resume previous medications. YOU HAD AN ENDOSCOPIC PROCEDURE TODAY AT THE [facility]:   Refer to the procedure report that was given to you for any specific questions about what was found during the examination.  If the procedure report does not answer your questions, please call your gastroenterologist to clarify.  If you requested that your care partner not be given the details of your procedure findings, then the procedure report has been included in a sealed envelope for you to review at your convenience later.  YOU SHOULD EXPECT: Some feelings of bloating in the abdomen. Passage of more gas than usual.  Walking can help get rid of the air that was put into your GI tract during the procedure and reduce the bloating. If you had a lower endoscopy (such as a colonoscopy or flexible sigmoidoscopy) you may notice spotting of blood in your stool or on the toilet paper. If you underwent a bowel prep for your procedure, you may not have a normal bowel movement for a few days.  Please Note:  You might notice some irritation and congestion in your nose or some drainage.  This is from the oxygen used during your procedure.  There is no need for concern and it should clear up in a day or so.  SYMPTOMS TO REPORT IMMEDIATELY:    Following upper endoscopy (EGD)  Vomiting of blood or coffee ground material  New chest pain or pain under the shoulder blades  Painful or persistently difficult swallowing  New shortness of breath  Fever of 100F or higher  Black, tarry-looking stools  For urgent or emergent issues, a gastroenterologist can be reached at any hour by calling [phone].   DIET: Your first meal following the procedure should be a small meal and then it is ok to progress to your normal diet. Heavy or fried foods are harder to digest and may make you feel nauseous or bloated.  Likewise, meals heavy in dairy and vegetables can increase  bloating.  Drink plenty of fluids but you should avoid alcoholic beverages for 24 hours.  ACTIVITY:  You should plan to take it easy for the rest of today and you should NOT DRIVE or use heavy machinery until tomorrow (because of the sedation medicines used during the test).    FOLLOW UP: Our staff will call the number listed on your records the next business day following your procedure to check on you and address any questions or concerns that you may have regarding the information given to you following your procedure. If we do not reach you, we will leave a message.  However, if you are feeling well and you are not experiencing any problems, there is no need to return our call.  We will assume that you have returned to your regular daily activities without incident.  If any biopsies were taken you will be contacted by phone or by letter within the next 1-3 weeks.  Please call us at [phone] if you have not heard about the biopsies in 3 weeks.    SIGNATURES/CONFIDENTIALITY: You and/or your care partner have signed paperwork which will be entered into your electronic medical record.  These signatures attest to the fact that that the information above on your After Visit Summary has been reviewed and is understood.  Full responsibility of the confidentiality of this discharge information lies with you and/or your care-partner. 

## 2015-06-27 NOTE — Progress Notes (Signed)
Transferred to recovery room. A/O x3, pleased with MAC.  VSS.  Report to Cecelia, RN. 

## 2015-06-27 NOTE — Op Note (Signed)
[place]  [address] [place], [address]   ENDOSCOPY PROCEDURE REPORT  PATIENT: Charlene Reynolds, Herschberger  MR#: [id number] BIRTHDATE: 1996-08-07 , 18  yrs. old GENDER: female ENDOSCOPIST: Ladene Artist, MD, [facility] REFERRED BY:  Judithann Sauger, MD PROCEDURE DATE:  06/27/2015 PROCEDURE:  EGD, diagnostic ASA CLASS:     Class II INDICATIONS:  epigastric pain and abdominal pain in the upper right quadrant. MEDICATIONS: Propofol 150 mg IV TOPICAL ANESTHETIC: none DESCRIPTION OF PROCEDURE: After the risks benefits and alternatives of the procedure were thoroughly explained, informed consent was obtained.  The LB [id number] endoscope was introduced through the mouth and advanced to the second portion of the duodenum , Without limitations.  The instrument was slowly withdrawn as the mucosa was fully examined.    ESOPHAGUS: The mucosa of the esophagus appeared normal. STOMACH: The mucosa of the stomach appeared normal. DUODENUM: The duodenal mucosa showed no abnormalities in the bulb and 2nd part of the duodenum.  Retroflexed views revealed no abnormalities.     The scope was then withdrawn from the patient and the procedure completed.  COMPLICATIONS: There were no immediate complications.  ENDOSCOPIC IMPRESSION: 1.   EGD appeared normal  RECOMMENDATIONS: 1.  Anti-reflux regimen 2.  Continue PPI qam, Bentyl tid and Miralax   eSigned:  Ladene Artist, MD, [facility] 06/27/2015 8:23 AM

## 2015-06-28 ENCOUNTER — Telehealth: Payer: Self-pay | Admitting: *Deleted

## 2015-06-28 NOTE — Telephone Encounter (Signed)
  Follow up Call-no answer, left message to call if questions or concerns.     

## 2015-07-05 ENCOUNTER — Other Ambulatory Visit (INDEPENDENT_AMBULATORY_CARE_PROVIDER_SITE_OTHER): Payer: BLUE CROSS/BLUE SHIELD

## 2015-07-05 DIAGNOSIS — R1013 Epigastric pain: Secondary | ICD-10-CM | POA: Diagnosis not present

## 2015-07-05 DIAGNOSIS — R1011 Right upper quadrant pain: Secondary | ICD-10-CM

## 2015-07-05 DIAGNOSIS — K5900 Constipation, unspecified: Secondary | ICD-10-CM | POA: Diagnosis not present

## 2015-07-05 DIAGNOSIS — R110 Nausea: Secondary | ICD-10-CM

## 2015-07-05 LAB — HEMOCCULT SLIDES (X 3 CARDS)
Fecal Occult Blood: NEGATIVE
OCCULT 1: NEGATIVE
OCCULT 2: NEGATIVE
OCCULT 3: NEGATIVE
OCCULT 4: NEGATIVE
OCCULT 5: NEGATIVE

## 2015-07-08 ENCOUNTER — Other Ambulatory Visit: Payer: Self-pay

## 2015-08-01 ENCOUNTER — Other Ambulatory Visit: Payer: Self-pay | Admitting: Pediatrics

## 2015-08-01 ENCOUNTER — Other Ambulatory Visit (HOSPITAL_COMMUNITY): Payer: Self-pay | Admitting: Pediatrics

## 2015-08-01 DIAGNOSIS — R52 Pain, unspecified: Secondary | ICD-10-CM

## 2015-08-01 DIAGNOSIS — M79604 Pain in right leg: Secondary | ICD-10-CM

## 2015-08-01 DIAGNOSIS — M79605 Pain in left leg: Principal | ICD-10-CM

## 2015-08-02 ENCOUNTER — Encounter: Payer: Self-pay | Admitting: Physician Assistant

## 2015-08-02 ENCOUNTER — Ambulatory Visit (INDEPENDENT_AMBULATORY_CARE_PROVIDER_SITE_OTHER): Payer: BLUE CROSS/BLUE SHIELD | Admitting: Physician Assistant

## 2015-08-02 ENCOUNTER — Ambulatory Visit (HOSPITAL_COMMUNITY)
Admission: RE | Admit: 2015-08-02 | Discharge: 2015-08-02 | Disposition: A | Discharge status: Home / Self Care | Payer: BLUE CROSS/BLUE SHIELD | Source: Ambulatory Visit | Attending: Family Medicine | Admitting: Family Medicine

## 2015-08-02 VITALS — BP 112/80 | HR 84 | Ht 62.0 in | Wt 203.5 lb

## 2015-08-02 DIAGNOSIS — M79605 Pain in left leg: Secondary | ICD-10-CM | POA: Insufficient documentation

## 2015-08-02 DIAGNOSIS — R52 Pain, unspecified: Secondary | ICD-10-CM | POA: Diagnosis not present

## 2015-08-02 DIAGNOSIS — K648 Other hemorrhoids: Secondary | ICD-10-CM | POA: Diagnosis not present

## 2015-08-02 DIAGNOSIS — K589 Irritable bowel syndrome without diarrhea: Secondary | ICD-10-CM | POA: Diagnosis not present

## 2015-08-02 DIAGNOSIS — M79604 Pain in right leg: Secondary | ICD-10-CM | POA: Insufficient documentation

## 2015-08-02 DIAGNOSIS — M7989 Other specified soft tissue disorders: Secondary | ICD-10-CM | POA: Diagnosis not present

## 2015-08-02 DIAGNOSIS — K625 Hemorrhage of anus and rectum: Secondary | ICD-10-CM

## 2015-08-02 DIAGNOSIS — K219 Gastro-esophageal reflux disease without esophagitis: Secondary | ICD-10-CM | POA: Diagnosis not present

## 2015-08-02 DIAGNOSIS — M79606 Pain in leg, unspecified: Secondary | ICD-10-CM | POA: Diagnosis present

## 2015-08-02 MED ORDER — HYDROCORTISONE ACETATE 25 MG RE SUPP: 25.0000 mg | Freq: Two times a day (BID) | RECTAL | Status: DC

## 2015-08-02 MED ORDER — LUBIPROSTONE 8 MCG PO CAPS: 8.0000 ug | ORAL_CAPSULE | Freq: Two times a day (BID) | ORAL | Status: DC

## 2015-08-02 MED ORDER — PANTOPRAZOLE SODIUM 40 MG PO TBEC: DELAYED_RELEASE_TABLET | ORAL | Status: DC

## 2015-08-02 NOTE — Patient Instructions (Addendum)
We have sent the following medications to your pharmacy for you to pick up at your convenience: Pantoprazole 40 mg every morning 30 mins before breakfast.  Anusol suppositories 1 per rectum twice a day for 10 days Amitiza 8 mcg one tab twice a day  Try to eat more P fruits: peaches, pears, prunes, plums, pineapple to help with bowel movements  Drink more water.   Call in two weeks and let Cecille Rubin know if you are feeling any better.   You have been given a handout about hemorrhoids.  Follow up in two months with Cecille Rubin, call back to schedule.

## 2015-08-02 NOTE — Progress Notes (Signed)
VASCULAR LAB PRELIMINARY  PRELIMINARY  PRELIMINARY  PRELIMINARY  Bilateral lower extremity venous duplex  completed.    Preliminary report:  Bilateral:  No evidence of DVT, superficial thrombosis, or Baker's Cyst.    Ameris Akamine, RVT 08/02/2015, 9:27 AM

## 2015-08-03 ENCOUNTER — Encounter: Payer: Self-pay | Admitting: Physician Assistant

## 2015-08-03 NOTE — Progress Notes (Signed)
History of Present Illness: Charlene Reynolds  Is a pleasant 19 year old female who was initially evaluated in the office in July with complaints of abdominal pain and nausea. She underwent an upper endoscopy by Dr. Fuller Plan on 06/27/2015 which appeared to be normal. She was advised to continue pantoprazole. She was also complaining of  Constipation at her last visit and was advised to use Mira lax. She returns today stating that her upper GI symptoms are much better. She has no epigastric pain or nausea. Her main complaint today revolves around continued constipation. Despite using Mira lax she is skipping several days between bowel movements and then passes nugget like stools. Gets a lot of lower abdominal cramping prior to defecation, relieved with defecation. She was prescribed Bentyl which she states provides no relief. She has had several episodes of bloody spotting on the toilet tissue and one episode of blood dripping in the commode. She has no pain during defecation but she does have a sensation of incomplete evacuation.   Past Medical History  Diagnosis Date  . Endometriosis   . Allergic rhinitis   . Eczema   . Ovarian cyst bilateral    Past Surgical History  Procedure Laterality Date  . Wisdom tooth extraction     Family History  Problem Relation Age of Onset  . Asthma Mother   . Hypertension Mother   . Irritable bowel syndrome Mother   . Diabetes Paternal Grandfather   . Diabetes Paternal Grandmother   . Breast cancer Maternal Aunt    Social History  Substance Use Topics  . Smoking status: Former Smoker    Types: Cigars  . Smokeless tobacco: Never Used  . Alcohol Use: No   Current Outpatient Prescriptions  Medication Sig Dispense Refill  . fluticasone (FLONASE) 50 MCG/ACT nasal spray Place 1 spray into the nose.    . ibuprofen (ADVIL,MOTRIN) 800 MG tablet Take 800 mg by mouth every 8 (eight) hours as needed.    . pantoprazole (PROTONIX) 40 MG tablet Take 1 tablet every  morning 30 minutes prior to breakfast. 30 tablet 6  . hydrocortisone (ANUSOL-HC) 25 MG suppository Place 1 suppository (25 mg total) rectally 2 (two) times daily. 20 suppository 1  . lubiprostone (AMITIZA) 8 MCG capsule Take 1 capsule (8 mcg total) by mouth 2 (two) times daily with a meal. 60 capsule 4   No current facility-administered medications for this visit.   Allergies  Allergen Reactions  . Cherry Rash  . Chocolate Rash  . Peanut-Containing Drug Products Rash  . Sesame Oil Rash  . Shellfish Allergy Rash  . Soybean Oil Rash  . Hydrocodone Nausea And Vomiting  . Soy Allergy Other (See Comments)    Not a true allergy., but flares eczema     Review of Systems: Gen: Denies any fever, chills, sweats, anorexia, fatigue, weakness, malaise, weight loss, and sleep disorder CV: Denies chest pain, angina, palpitations, syncope, orthopnea, PND, peripheral edema, and claudication. Resp: Denies dyspnea at rest, dyspnea with exercise, cough, sputum, wheezing, coughing up blood, and pleurisy. GI: Denies vomiting blood, jaundice, and fecal incontinence.   Denies dysphagia or odynophagia. GU : Denies urinary burning, blood in urine, urinary frequency, urinary hesitancy, nocturnal urination, and urinary incontinence. MS: Denies joint pain, limitation of movement, and swelling, stiffness, low back pain, extremity pain. Denies muscle weakness, cramps, atrophy.  Derm: Denies rash, itching, dry skin, hives, moles, warts, or unhealing ulcers.  Psych: Denies depression, anxiety, memory loss, suicidal ideation, hallucinations, paranoia, and  confusion. Heme: Denies bruising, bleeding, and enlarged lymph nodes. Neuro:  Denies any headaches, dizziness, paresthesia Endo:  Denies any problems with DM, thyroid, adrenal   Physical Exam: BP 112/80 mmHg  Pulse 84  Ht 5\' 2"  (1.575 m)  Wt 203 lb 8 oz (92.307 kg)  BMI 37.21 kg/m2 General: Pleasant, well developed , female in no acute distress Head:  Normocephalic and atraumatic Eyes:  sclerae anicteric, conjunctiva pink  Ears: Normal auditory acuity Lungs: Clear throughout to auscultation Heart: Regular rate and rhythm Abdomen: Soft, non distended, non-tender. No masses, no hepatomegaly. Normal bowel sounds Rectal:  Small external hemorrhoid , brown stool, Hemoccult-negative. Anoscopy with small internal hemorrhoid. Musculoskeletal: Symmetrical with no gross deformities  Extremities: No edema  Neurological: Alert oriented x 4, grossly nonfocal Psychological:  Alert and cooperative. Normal mood and affect  Assessment and Recommendations:  #1. GERD. She will continue an antireflux regimen and will continue pantoprazole 40 mg by mouth every morning.    #2. Hemorrhoids. She will be given a trial of Anusol HC suppositories 1 per rectum twice daily for 10 days. She will use Tucks wipes as needed.    #3. IBS -constipation predominant. She will be given a trial of Amitiza 8 g 1 by mouth twice daily. She's been instructed to add "P fruits" to her diet and increase water intake as well. She will call us in 2 weeks to let us know if the Amitiza that is providing relief. If not , she will be considered for a higher dose.    She will follow up in 2 months, sooner if needed.        Tauriel Scronce, Vita Barley PA-C 08/03/2015,

## 2015-08-05 NOTE — Progress Notes (Signed)
Reviewed and agree with management plan.  Talissa Apple T. Emonii Wienke, MD FACG 

## 2015-08-19 ENCOUNTER — Telehealth: Payer: Self-pay | Admitting: *Deleted

## 2015-08-19 ENCOUNTER — Other Ambulatory Visit: Payer: Self-pay | Admitting: *Deleted

## 2015-08-19 MED ORDER — LINACLOTIDE 145 MCG PO CAPS: 145.0000 ug | ORAL_CAPSULE | Freq: Every day | ORAL | Status: DC

## 2015-08-19 NOTE — Telephone Encounter (Signed)
LM for patient to advise we sent a prescription for Linzess 145 mcg for constipation.  The Amitiza medication that we did a prior authorization  was denied by [facility]. They are suggesting the patient try  A trial of Linzess 145 mcg.

## 2015-08-19 NOTE — Telephone Encounter (Signed)
-----   Message from [employer], PA-C sent at 08/19/2015 11:00 AM EDT ----- Yes 145 qd. Thanks ----- Message -----    From: Tonette Bihari, CMA    Sent: 08/19/2015   9:35 AM      To: Vita Barley Hvozdovic, PA-C  Lori, [facility] your having a good day so far.  You saw this patient and perscribed Amitiza 8 mcg.  I did a prior authorization. This was denied. [employer] are suggesting she try, Linzess, Lactulose. They said she only tried Miralax.  Did you want to prescribe Linezess?   Let me know, Thanks,  Marisue Humble CMA

## 2019-06-16 ENCOUNTER — Other Ambulatory Visit: Payer: Self-pay

## 2019-06-16 ENCOUNTER — Inpatient Hospital Stay (HOSPITAL_COMMUNITY): Payer: BC Managed Care – PPO

## 2019-06-16 ENCOUNTER — Inpatient Hospital Stay (HOSPITAL_COMMUNITY)
Admission: AD | Admit: 2019-06-16 | Discharge: 2019-06-20 | DRG: 788 | Disposition: A | Discharge status: Home / Self Care | Payer: BC Managed Care – PPO | Attending: Obstetrics and Gynecology | Admitting: Obstetrics and Gynecology

## 2019-06-16 ENCOUNTER — Encounter (HOSPITAL_COMMUNITY): Payer: Self-pay

## 2019-06-16 DIAGNOSIS — Z1159 Encounter for screening for other viral diseases: Secondary | ICD-10-CM

## 2019-06-16 DIAGNOSIS — O42913 Preterm premature rupture of membranes, unspecified as to length of time between rupture and onset of labor, third trimester: Principal | ICD-10-CM | POA: Diagnosis present

## 2019-06-16 DIAGNOSIS — O42013 Preterm premature rupture of membranes, onset of labor within 24 hours of rupture, third trimester: Secondary | ICD-10-CM | POA: Diagnosis present

## 2019-06-16 DIAGNOSIS — O9902 Anemia complicating childbirth: Secondary | ICD-10-CM | POA: Diagnosis present

## 2019-06-16 DIAGNOSIS — Z3A36 36 weeks gestation of pregnancy: Secondary | ICD-10-CM

## 2019-06-16 DIAGNOSIS — O368130 Decreased fetal movements, third trimester, not applicable or unspecified: Secondary | ICD-10-CM

## 2019-06-16 DIAGNOSIS — O288 Other abnormal findings on antenatal screening of mother: Secondary | ICD-10-CM

## 2019-06-16 DIAGNOSIS — D649 Anemia, unspecified: Secondary | ICD-10-CM | POA: Diagnosis present

## 2019-06-16 DIAGNOSIS — Z87891 Personal history of nicotine dependence: Secondary | ICD-10-CM

## 2019-06-16 DIAGNOSIS — O99214 Obesity complicating childbirth: Secondary | ICD-10-CM | POA: Diagnosis present

## 2019-06-16 LAB — URINALYSIS, ROUTINE W REFLEX MICROSCOPIC
Bilirubin Urine: NEGATIVE
Glucose, UA: NEGATIVE mg/dL
Hgb urine dipstick: NEGATIVE
Ketones, ur: NEGATIVE mg/dL
Nitrite: NEGATIVE
Protein, ur: NEGATIVE mg/dL
Specific Gravity, Urine: 1.015 (ref 1.005–1.030)
pH: 7 (ref 5.0–8.0)

## 2019-06-16 MED ORDER — LACTATED RINGERS IV BOLUS: 1000.0000 mL | Freq: Once | INTRAVENOUS | Status: DC

## 2019-06-16 MED ORDER — ACETAMINOPHEN 500 MG PO TABS
1000.0000 mg | ORAL_TABLET | Freq: Once | ORAL | Status: AC
  Administered 2019-06-16: 23:00:00 1000 mg via ORAL
  Filled 2019-06-16: qty 2

## 2019-06-16 NOTE — MAU Note (Signed)
Pt here with c/o contractions; having lower back pain since about 1600. Denies any bleeding or leaking. Lost mucus plug. Was checked on Monday and was 1 cm.

## 2019-06-16 NOTE — MAU Provider Note (Signed)
Chief Complaint:  Contractions   None     HPI: Charlene Reynolds is a 23 y.o. G1P0 at [redacted]w[redacted]d who presents to maternity admissions for labor evaluation with pelvic and back pain described as severe menstrual-like cramping.  Pain is low in her abdomen and in her low back, constant back pain with intermittent low abdominal pain. She also reports decreased fetal movement this afternoon and evening.  There are no other symptoms. She has not tried any treatments.  Pregnancy has been uncomplicated with care at [facility].    HPI  Past Medical History: Past Medical History:  Diagnosis Date  . Allergic rhinitis   . Eczema   . Endometriosis   . Ovarian cyst bilateral    Past obstetric history: OB History  Gravida Para Term Preterm AB Living  1            SAB TAB Ectopic Multiple Live Births               # Outcome Date GA Lbr Len/2nd Weight Sex Delivery Anes PTL Lv  1 Current             Past Surgical History: Past Surgical History:  Procedure Laterality Date  . WISDOM TOOTH EXTRACTION      Family History: Family History  Problem Relation Age of Onset  . Asthma Mother   . Hypertension Mother   . Irritable bowel syndrome Mother   . Diabetes Paternal Grandfather   . Diabetes Paternal Grandmother   . Breast cancer Maternal Aunt     Social History: Social History   Tobacco Use  . Smoking status: Former Smoker    Types: Cigars  . Smokeless tobacco: Never Used  Substance Use Topics  . Alcohol use: No    Alcohol/week: 0.0 standard drinks  . Drug use: No    Allergies:  Allergies  Allergen Reactions  . Cherry Rash  . Chocolate Rash  . Peanut-Containing Drug Products Rash  . Sesame Oil Rash  . Shellfish Allergy Rash  . Soybean Oil Rash  . Hydrocodone Nausea And Vomiting  . Soy Allergy Other (See Comments)    Not a true allergy., but flares eczema  . Sulfa Antibiotics Hives    Meds:  Medications Prior to Admission  Medication Sig Dispense Refill  Last Dose  . diphenhydrAMINE (BENADRYL) 25 mg capsule Take 25 mg by mouth every 6 (six) hours as needed.   Past Week at Unknown time  . fluticasone (FLONASE) 50 MCG/ACT nasal spray Place 1 spray into the nose.     . hydrocortisone (ANUSOL-HC) 25 MG suppository Place 1 suppository (25 mg total) rectally 2 (two) times daily. 20 suppository 1   . ibuprofen (ADVIL,MOTRIN) 800 MG tablet Take 800 mg by mouth every 8 (eight) hours as needed.     . Linaclotide (LINZESS) 145 MCG CAPS capsule Take 1 capsule (145 mcg total) by mouth daily. 30 capsule 1   . lubiprostone (AMITIZA) 8 MCG capsule Take 1 capsule (8 mcg total) by mouth 2 (two) times daily with a meal. 60 capsule 4   . pantoprazole (PROTONIX) 40 MG tablet Take 1 tablet every morning 30 minutes prior to breakfast. 30 tablet 6   . Prenatal Vit-Fe Fumarate-FA (MULTIVITAMIN-PRENATAL) 27-0.8 MG TABS tablet Take 1 tablet by mouth daily at 12 noon.   More than a month at Unknown time    ROS:  Review of Systems  Constitutional: Negative for chills, fatigue and fever.  Eyes: Negative for visual disturbance.  Respiratory: Negative for shortness of breath.   Cardiovascular: Negative for chest pain.  Gastrointestinal: Positive for abdominal pain. Negative for nausea and vomiting.  Genitourinary: Positive for pelvic pain. Negative for difficulty urinating, dysuria, flank pain, vaginal bleeding, vaginal discharge and vaginal pain.  Musculoskeletal: Positive for back pain.  Neurological: Negative for dizziness and headaches.  Psychiatric/Behavioral: Negative.      I have reviewed patient's Past Medical Hx, Surgical Hx, Family Hx, Social Hx, medications and allergies.   Physical Exam   Patient Vitals for the past 24 hrs:  BP Temp Temp src Pulse Resp SpO2 Height Weight  06/16/19 2250 - 98.6 F (37 C) Oral - - - - -  06/16/19 2215 - - - (!) 111 - 100 % - -  06/16/19 2200 106/64 - - (!) 123 - 94 % - -  06/16/19 2150 - - - - - 98 % - -  06/16/19 2145  - - - - - 98 % - -  06/16/19 2135 (!) 114/52 99.4 F (37.4 C) Oral (!) 135 (!) 22 99 % 5\' 1"  (1.549 m) -  06/16/19 2115 - - - - - - - 110.2 kg   Constitutional: Well-developed, well-nourished female in no acute distress.  Cardiovascular: normal rate Respiratory: normal effort GI: Abd soft, non-tender, gravid appropriate for gestational age.  MS: Extremities nontender, no edema, normal ROM Neurologic: Alert and oriented x 4.  GU: Neg CVAT.  PELVIC EXAM: Cervix pink, visually closed, without lesion, scant white creamy discharge, vaginal walls and external genitalia normal Bimanual exam: Cervix 0/long/high, firm, anterior, neg CMT, uterus nontender, nonenlarged, adnexa without tenderness, enlargement, or mass  Dilation: 1 Effacement (%): 50 Cervical Position: Posterior Station: -3 Presentation: Vertex Exam by:: Passenger transport manager RN  FHT:  Baseline 150 , moderate variability, no accelerations, no decelerations but some subtle FHR changes suspicious for lates but not definitive criteria Contractions: q 2-10 mins, mild to moderate to palpation   Labs: Results for orders placed or performed during the hospital encounter of 06/16/19 (from the past 24 hour(s))  Urinalysis, Routine w reflex microscopic     Status: Abnormal   Collection Time: 06/16/19  9:19 PM  Result Value Ref Range   Color, Urine YELLOW YELLOW   APPearance CLEAR CLEAR   Specific Gravity, Urine 1.015 1.005 - 1.030   pH 7.0 5.0 - 8.0   Glucose, UA NEGATIVE NEGATIVE mg/dL   Hgb urine dipstick NEGATIVE NEGATIVE   Bilirubin Urine NEGATIVE NEGATIVE   Ketones, ur NEGATIVE NEGATIVE mg/dL   Protein, ur NEGATIVE NEGATIVE mg/dL   Nitrite NEGATIVE NEGATIVE   Leukocytes,Ua SMALL (A) NEGATIVE   RBC / HPF 0-5 0 - 5 RBC/hpf   WBC, UA 6-10 0 - 5 WBC/hpf   Bacteria, UA RARE (A) NONE SEEN   Squamous Epithelial / LPF 0-5 0 - 5   Mucus PRESENT       Imaging:  No results found.  MAU Course/MDM: Orders Placed This Encounter   Procedures  . [place] MFM Fetal BPP Wo Non Stress  . Urinalysis, Routine w reflex microscopic    Meds ordered this encounter  Medications  . DISCONTD: lactated ringers bolus 1,000 mL  . acetaminophen (TYLENOL) tablet 1,000 mg     NST reviewed and not reactive Cervix 1 cm, CTX irregular on toco, no signs of active labor BPP ordered but pt called out to report gush of fluid Ferning positive with large amount of fluid on pad Admit for PPROM, continuous monitoring BMZ given  per protocol at 36 weeks, second dose ordered if not delivered in 24 hours Dr Corinna Capra to assume care of pt GBS PCR pending    Assessment: 1. Preterm premature rupture of membranes (PPROM) with onset of labor within 24 hours of rupture in third trimester, antepartum   2. Decreased fetal movements in third trimester, single or unspecified fetus   3. NST (non-stress test) nonreactive     Plan: Admit to L&D GBS PCR and COVID swab pending   Fatima Blank Certified Nurse-Midwife 06/17/2019 12:37 AM

## 2019-06-17 ENCOUNTER — Encounter (HOSPITAL_COMMUNITY)
Admission: AD | Disposition: A | Discharge status: Home / Self Care | Payer: Self-pay | Source: Home / Self Care | Attending: Obstetrics and Gynecology

## 2019-06-17 ENCOUNTER — Encounter (HOSPITAL_COMMUNITY): Payer: Self-pay | Admitting: Anesthesiology

## 2019-06-17 ENCOUNTER — Inpatient Hospital Stay (HOSPITAL_COMMUNITY): Payer: BC Managed Care – PPO | Admitting: Anesthesiology

## 2019-06-17 ENCOUNTER — Other Ambulatory Visit: Payer: Self-pay

## 2019-06-17 DIAGNOSIS — O99214 Obesity complicating childbirth: Secondary | ICD-10-CM | POA: Diagnosis present

## 2019-06-17 DIAGNOSIS — Z3A36 36 weeks gestation of pregnancy: Secondary | ICD-10-CM | POA: Diagnosis not present

## 2019-06-17 DIAGNOSIS — O42913 Preterm premature rupture of membranes, unspecified as to length of time between rupture and onset of labor, third trimester: Secondary | ICD-10-CM | POA: Diagnosis present

## 2019-06-17 DIAGNOSIS — D649 Anemia, unspecified: Secondary | ICD-10-CM | POA: Diagnosis present

## 2019-06-17 DIAGNOSIS — Z1159 Encounter for screening for other viral diseases: Secondary | ICD-10-CM | POA: Diagnosis not present

## 2019-06-17 DIAGNOSIS — O368130 Decreased fetal movements, third trimester, not applicable or unspecified: Secondary | ICD-10-CM | POA: Diagnosis present

## 2019-06-17 DIAGNOSIS — O42013 Preterm premature rupture of membranes, onset of labor within 24 hours of rupture, third trimester: Secondary | ICD-10-CM | POA: Diagnosis present

## 2019-06-17 DIAGNOSIS — Z87891 Personal history of nicotine dependence: Secondary | ICD-10-CM | POA: Diagnosis not present

## 2019-06-17 DIAGNOSIS — O9902 Anemia complicating childbirth: Secondary | ICD-10-CM | POA: Diagnosis present

## 2019-06-17 LAB — CBC
HCT: 35.3 % — ABNORMAL LOW (ref 36.0–46.0)
Hemoglobin: 11.2 g/dL — ABNORMAL LOW (ref 12.0–15.0)
MCH: 27.1 pg (ref 26.0–34.0)
MCHC: 31.7 g/dL (ref 30.0–36.0)
MCV: 85.3 fL (ref 80.0–100.0)
Platelets: 173 10*3/uL (ref 150–400)
RBC: 4.14 MIL/uL (ref 3.87–5.11)
RDW: 13.8 % (ref 11.5–15.5)
WBC: 12 10*3/uL — ABNORMAL HIGH (ref 4.0–10.5)
nRBC: 0.2 % (ref 0.0–0.2)

## 2019-06-17 LAB — RPR: RPR Ser Ql: NONREACTIVE

## 2019-06-17 LAB — SARS CORONAVIRUS 2 BY RT PCR (HOSPITAL ORDER, PERFORMED IN ~~LOC~~ HOSPITAL LAB): SARS Coronavirus 2: NEGATIVE

## 2019-06-17 LAB — POCT FERN TEST: POCT Fern Test: POSITIVE

## 2019-06-17 LAB — TYPE AND SCREEN
ABO/RH(D): O POS
Antibody Screen: NEGATIVE

## 2019-06-17 SURGERY — Surgical Case
Anesthesia: Epidural

## 2019-06-17 MED ORDER — BUTORPHANOL TARTRATE 1 MG/ML IJ SOLN
2.0000 mg | Freq: Once | INTRAMUSCULAR | Status: AC
  Administered 2019-06-17: 02:00:00 2 mg via INTRAVENOUS
  Filled 2019-06-17: qty 2

## 2019-06-17 MED ORDER — KETOROLAC TROMETHAMINE 30 MG/ML IJ SOLN: 30.0000 mg | Freq: Once | INTRAMUSCULAR | Status: DC | PRN

## 2019-06-17 MED ORDER — LIDOCAINE-EPINEPHRINE (PF) 2 %-1:200000 IJ SOLN
INTRAMUSCULAR | Status: AC
  Filled 2019-06-17: qty 10

## 2019-06-17 MED ORDER — LACTATED RINGERS IV SOLN
500.0000 mL | INTRAVENOUS | Status: DC | PRN
  Administered 2019-06-17: 04:00:00 1000 mL via INTRAVENOUS

## 2019-06-17 MED ORDER — MENTHOL 3 MG MT LOZG: 1.0000 | LOZENGE | OROMUCOSAL | Status: DC | PRN

## 2019-06-17 MED ORDER — DIPHENHYDRAMINE HCL 50 MG/ML IJ SOLN: 12.5000 mg | INTRAMUSCULAR | Status: DC | PRN

## 2019-06-17 MED ORDER — SIMETHICONE 80 MG PO CHEW: 80.0000 mg | CHEWABLE_TABLET | ORAL | Status: DC | PRN

## 2019-06-17 MED ORDER — LACTATED RINGERS IV SOLN
500.0000 mL | Freq: Once | INTRAVENOUS | Status: AC
  Administered 2019-06-17: 01:00:00 500 mL via INTRAVENOUS

## 2019-06-17 MED ORDER — TRAMADOL HCL 50 MG PO TABS
50.0000 mg | ORAL_TABLET | Freq: Four times a day (QID) | ORAL | Status: DC | PRN
  Administered 2019-06-18 – 2019-06-19 (×4): 50 mg via ORAL
  Filled 2019-06-17 (×5): qty 1

## 2019-06-17 MED ORDER — MEPERIDINE HCL 25 MG/ML IJ SOLN: 6.2500 mg | INTRAMUSCULAR | Status: DC | PRN

## 2019-06-17 MED ORDER — EPHEDRINE 5 MG/ML INJ: 10.0000 mg | INTRAVENOUS | Status: DC | PRN

## 2019-06-17 MED ORDER — BUTORPHANOL TARTRATE 1 MG/ML IJ SOLN
INTRAMUSCULAR | Status: AC
  Filled 2019-06-17: qty 1

## 2019-06-17 MED ORDER — PHENYLEPHRINE 40 MCG/ML (10ML) SYRINGE FOR IV PUSH (FOR BLOOD PRESSURE SUPPORT): 80.0000 ug | PREFILLED_SYRINGE | INTRAVENOUS | Status: DC | PRN

## 2019-06-17 MED ORDER — SENNOSIDES-DOCUSATE SODIUM 8.6-50 MG PO TABS
2.0000 | ORAL_TABLET | ORAL | Status: DC
  Administered 2019-06-18 – 2019-06-20 (×3): 2 via ORAL
  Filled 2019-06-17 (×4): qty 2

## 2019-06-17 MED ORDER — MORPHINE SULFATE (PF) 0.5 MG/ML IJ SOLN
INTRAMUSCULAR | Status: DC | PRN
  Administered 2019-06-17: 3 mg via EPIDURAL

## 2019-06-17 MED ORDER — MEASLES, MUMPS & RUBELLA VAC IJ SOLR: 0.5000 mL | Freq: Once | INTRAMUSCULAR | Status: DC

## 2019-06-17 MED ORDER — WITCH HAZEL-GLYCERIN EX PADS: 1.0000 "application " | MEDICATED_PAD | CUTANEOUS | Status: DC | PRN

## 2019-06-17 MED ORDER — DIPHENHYDRAMINE HCL 25 MG PO CAPS: 25.0000 mg | ORAL_CAPSULE | Freq: Four times a day (QID) | ORAL | Status: DC | PRN

## 2019-06-17 MED ORDER — DIPHENHYDRAMINE HCL 25 MG PO CAPS: 25.0000 mg | ORAL_CAPSULE | ORAL | Status: DC | PRN

## 2019-06-17 MED ORDER — NALBUPHINE HCL 10 MG/ML IJ SOLN: 5.0000 mg | INTRAMUSCULAR | Status: DC | PRN

## 2019-06-17 MED ORDER — PRENATAL MULTIVITAMIN CH
1.0000 | ORAL_TABLET | Freq: Every day | ORAL | Status: DC
  Administered 2019-06-17 – 2019-06-20 (×4): 1 via ORAL
  Filled 2019-06-17 (×4): qty 1

## 2019-06-17 MED ORDER — ACETAMINOPHEN 325 MG PO TABS: 650.0000 mg | ORAL_TABLET | ORAL | Status: DC | PRN

## 2019-06-17 MED ORDER — NALOXONE HCL 4 MG/10ML IJ SOLN
1.0000 ug/kg/h | INTRAVENOUS | Status: DC | PRN
  Filled 2019-06-17: qty 5

## 2019-06-17 MED ORDER — ONDANSETRON HCL 4 MG/2ML IJ SOLN
4.0000 mg | Freq: Four times a day (QID) | INTRAMUSCULAR | Status: DC | PRN
  Administered 2019-06-17: 06:00:00 4 mg via INTRAVENOUS

## 2019-06-17 MED ORDER — SCOPOLAMINE 1 MG/3DAYS TD PT72
1.0000 | MEDICATED_PATCH | Freq: Once | TRANSDERMAL | Status: AC
  Administered 2019-06-17: 11:00:00 1.5 mg via TRANSDERMAL
  Filled 2019-06-17: qty 1

## 2019-06-17 MED ORDER — KETOROLAC TROMETHAMINE 30 MG/ML IJ SOLN
30.0000 mg | Freq: Four times a day (QID) | INTRAMUSCULAR | Status: AC | PRN
  Administered 2019-06-17 (×2): 30 mg via INTRAVENOUS
  Filled 2019-06-17 (×2): qty 1

## 2019-06-17 MED ORDER — COCONUT OIL OIL: 1.0000 "application " | TOPICAL_OIL | Status: DC | PRN

## 2019-06-17 MED ORDER — SIMETHICONE 80 MG PO CHEW
80.0000 mg | CHEWABLE_TABLET | Freq: Three times a day (TID) | ORAL | Status: DC
  Administered 2019-06-17 – 2019-06-20 (×10): 80 mg via ORAL
  Filled 2019-06-17 (×10): qty 1

## 2019-06-17 MED ORDER — HYDROMORPHONE HCL 1 MG/ML IJ SOLN: 0.2500 mg | INTRAMUSCULAR | Status: DC | PRN

## 2019-06-17 MED ORDER — OXYTOCIN 40 UNITS IN NORMAL SALINE INFUSION - SIMPLE MED
INTRAVENOUS | Status: AC
  Filled 2019-06-17: qty 1000

## 2019-06-17 MED ORDER — OXYTOCIN 40 UNITS IN NORMAL SALINE INFUSION - SIMPLE MED: 2.5000 [IU]/h | INTRAVENOUS | Status: AC

## 2019-06-17 MED ORDER — LACTATED RINGERS IV SOLN
INTRAVENOUS | Status: DC
  Administered 2019-06-18: 999 mL via INTRAVENOUS

## 2019-06-17 MED ORDER — SODIUM CHLORIDE 0.9 % IR SOLN
Status: DC | PRN
  Administered 2019-06-17: 1000 mL

## 2019-06-17 MED ORDER — NALBUPHINE HCL 10 MG/ML IJ SOLN: 5.0000 mg | Freq: Once | INTRAMUSCULAR | Status: DC | PRN

## 2019-06-17 MED ORDER — SIMETHICONE 80 MG PO CHEW
80.0000 mg | CHEWABLE_TABLET | ORAL | Status: DC
  Administered 2019-06-18 – 2019-06-20 (×3): 80 mg via ORAL
  Filled 2019-06-17 (×3): qty 1

## 2019-06-17 MED ORDER — FENTANYL CITRATE (PF) 100 MCG/2ML IJ SOLN
INTRAMUSCULAR | Status: AC
  Filled 2019-06-17: qty 2

## 2019-06-17 MED ORDER — DEXAMETHASONE SODIUM PHOSPHATE 10 MG/ML IJ SOLN
INTRAMUSCULAR | Status: DC | PRN
  Administered 2019-06-17: 10 mg via INTRAVENOUS

## 2019-06-17 MED ORDER — SODIUM CHLORIDE 0.9 % IV SOLN
INTRAVENOUS | Status: DC | PRN
  Administered 2019-06-17: 06:00:00 via INTRAVENOUS

## 2019-06-17 MED ORDER — LACTATED RINGERS IV SOLN
INTRAVENOUS | Status: DC
  Administered 2019-06-17 (×2): via INTRAVENOUS

## 2019-06-17 MED ORDER — NALOXONE HCL 0.4 MG/ML IJ SOLN: 0.4000 mg | INTRAMUSCULAR | Status: DC | PRN

## 2019-06-17 MED ORDER — FENTANYL CITRATE (PF) 100 MCG/2ML IJ SOLN
INTRAMUSCULAR | Status: DC | PRN
  Administered 2019-06-17 (×2): 50 ug via INTRAVENOUS
  Administered 2019-06-17: 100 ug via EPIDURAL

## 2019-06-17 MED ORDER — SODIUM CHLORIDE 0.9 % IV SOLN
2.0000 g | Freq: Two times a day (BID) | INTRAVENOUS | Status: DC
  Administered 2019-06-17: 06:00:00 2 g via INTRAVENOUS
  Filled 2019-06-17: qty 2

## 2019-06-17 MED ORDER — SOD CITRATE-CITRIC ACID 500-334 MG/5ML PO SOLN
30.0000 mL | ORAL | Status: DC | PRN
  Administered 2019-06-17: 05:00:00 30 mL via ORAL
  Filled 2019-06-17: qty 30

## 2019-06-17 MED ORDER — LIDOCAINE-EPINEPHRINE 2 %-1:100000 IJ SOLN
INTRAMUSCULAR | Status: DC | PRN
  Administered 2019-06-17: 10 mL via INTRADERMAL
  Administered 2019-06-17: 5 mL via INTRADERMAL

## 2019-06-17 MED ORDER — TETANUS-DIPHTH-ACELL PERTUSSIS 5-2.5-18.5 LF-MCG/0.5 IM SUSP: 0.5000 mL | Freq: Once | INTRAMUSCULAR | Status: DC

## 2019-06-17 MED ORDER — OXYTOCIN BOLUS FROM INFUSION: 500.0000 mL | Freq: Once | INTRAVENOUS | Status: DC

## 2019-06-17 MED ORDER — LIDOCAINE HCL (PF) 1 % IJ SOLN: 30.0000 mL | INTRAMUSCULAR | Status: DC | PRN

## 2019-06-17 MED ORDER — ACETAMINOPHEN 500 MG PO TABS
1000.0000 mg | ORAL_TABLET | Freq: Four times a day (QID) | ORAL | Status: AC
  Administered 2019-06-17 – 2019-06-18 (×4): 1000 mg via ORAL
  Filled 2019-06-17 (×4): qty 2

## 2019-06-17 MED ORDER — PROMETHAZINE HCL 25 MG/ML IJ SOLN
6.2500 mg | Freq: Once | INTRAMUSCULAR | Status: AC
  Administered 2019-06-17: 08:00:00 6.25 mg via INTRAVENOUS

## 2019-06-17 MED ORDER — SODIUM CHLORIDE 0.9% FLUSH: 3.0000 mL | INTRAVENOUS | Status: DC | PRN

## 2019-06-17 MED ORDER — OXYTOCIN 40 UNITS IN NORMAL SALINE INFUSION - SIMPLE MED: 2.5000 [IU]/h | INTRAVENOUS | Status: DC

## 2019-06-17 MED ORDER — ONDANSETRON HCL 4 MG/2ML IJ SOLN: 4.0000 mg | Freq: Three times a day (TID) | INTRAMUSCULAR | Status: DC | PRN

## 2019-06-17 MED ORDER — DIBUCAINE (PERIANAL) 1 % EX OINT: 1.0000 "application " | TOPICAL_OINTMENT | CUTANEOUS | Status: DC | PRN

## 2019-06-17 MED ORDER — KETOROLAC TROMETHAMINE 30 MG/ML IJ SOLN: 30.0000 mg | Freq: Four times a day (QID) | INTRAMUSCULAR | Status: AC | PRN

## 2019-06-17 MED ORDER — ZOLPIDEM TARTRATE 5 MG PO TABS: 5.0000 mg | ORAL_TABLET | Freq: Every evening | ORAL | Status: DC | PRN

## 2019-06-17 MED ORDER — MORPHINE SULFATE (PF) 0.5 MG/ML IJ SOLN
INTRAMUSCULAR | Status: AC
  Filled 2019-06-17: qty 10

## 2019-06-17 MED ORDER — PROMETHAZINE HCL 25 MG/ML IJ SOLN
INTRAMUSCULAR | Status: AC
  Filled 2019-06-17: qty 1

## 2019-06-17 MED ORDER — LIDOCAINE HCL (PF) 1 % IJ SOLN
INTRAMUSCULAR | Status: DC | PRN
  Administered 2019-06-17: 8 mL via EPIDURAL
  Administered 2019-06-17: 6 mL via EPIDURAL

## 2019-06-17 MED ORDER — SODIUM CHLORIDE (PF) 0.9 % IJ SOLN
INTRAMUSCULAR | Status: DC | PRN
  Administered 2019-06-17: 12 mL/h via EPIDURAL

## 2019-06-17 MED ORDER — PROMETHAZINE HCL 25 MG/ML IJ SOLN: 6.2500 mg | INTRAMUSCULAR | Status: DC | PRN

## 2019-06-17 MED ORDER — CEFOTETAN DISODIUM-DEXTROSE 2-2.08 GM-%(50ML) IV SOLR
INTRAVENOUS | Status: AC
  Filled 2019-06-17: qty 50

## 2019-06-17 MED ORDER — FENTANYL-BUPIVACAINE-NACL 0.5-0.125-0.9 MG/250ML-% EP SOLN
12.0000 mL/h | EPIDURAL | Status: DC | PRN
  Filled 2019-06-17: qty 250

## 2019-06-17 MED ORDER — ONDANSETRON HCL 4 MG/2ML IJ SOLN
INTRAMUSCULAR | Status: AC
  Filled 2019-06-17: qty 2

## 2019-06-17 MED ORDER — DEXAMETHASONE SODIUM PHOSPHATE 10 MG/ML IJ SOLN
INTRAMUSCULAR | Status: AC
  Filled 2019-06-17: qty 1

## 2019-06-17 MED ORDER — SODIUM CHLORIDE 0.9 % IV SOLN
INTRAVENOUS | Status: DC | PRN
  Administered 2019-06-17: 06:00:00 40 [IU] via INTRAVENOUS

## 2019-06-17 MED ORDER — BETAMETHASONE SOD PHOS & ACET 6 (3-3) MG/ML IJ SUSP
12.0000 mg | INTRAMUSCULAR | Status: DC
  Administered 2019-06-17: 02:00:00 12 mg via INTRAMUSCULAR
  Filled 2019-06-17: qty 2

## 2019-06-17 SURGICAL SUPPLY — 32 items
CHLORAPREP W/TINT 26ML (MISCELLANEOUS) ×3 IMPLANT
CLAMP CORD UMBIL (MISCELLANEOUS) IMPLANT
CLOTH BEACON ORANGE TIMEOUT ST (SAFETY) ×3 IMPLANT
DRSG OPSITE POSTOP 4X10 (GAUZE/BANDAGES/DRESSINGS) ×3 IMPLANT
ELECT REM PT RETURN 9FT ADLT (ELECTROSURGICAL) ×3
ELECTRODE REM PT RTRN 9FT ADLT (ELECTROSURGICAL) ×1 IMPLANT
EXTRACTOR VACUUM M CUP 4 TUBE (SUCTIONS) IMPLANT
EXTRACTOR VACUUM M CUP 4' TUBE (SUCTIONS)
GLOVE BIOGEL PI IND STRL 7.0 (GLOVE) ×1 IMPLANT
GLOVE BIOGEL PI INDICATOR 7.0 (GLOVE) ×2
GLOVE SURG ORTHO 8.0 STRL STRW (GLOVE) ×3 IMPLANT
GOWN STRL REUS W/TWL LRG LVL3 (GOWN DISPOSABLE) ×6 IMPLANT
HOVERMATT SINGLE USE (MISCELLANEOUS) ×3 IMPLANT
KIT ABG SYR 3ML LUER SLIP (SYRINGE) ×3 IMPLANT
NEEDLE HYPO 25X5/8 SAFETYGLIDE (NEEDLE) ×3 IMPLANT
NS IRRIG 1000ML POUR BTL (IV SOLUTION) ×3 IMPLANT
PACK C SECTION WH (CUSTOM PROCEDURE TRAY) ×3 IMPLANT
PAD OB MATERNITY 4.3X12.25 (PERSONAL CARE ITEMS) ×3 IMPLANT
PENCIL SMOKE EVAC W/HOLSTER (ELECTROSURGICAL) ×3 IMPLANT
RETRACTOR WND ALEXIS 25 LRG (MISCELLANEOUS) ×1 IMPLANT
RTRCTR WOUND ALEXIS 25CM LRG (MISCELLANEOUS) ×3
SUT MNCRL 0 VIOLET CTX 36 (SUTURE) ×3 IMPLANT
SUT MON AB 4-0 PS1 27 (SUTURE) ×3 IMPLANT
SUT MONOCRYL 0 CTX 36 (SUTURE) ×6
SUT PDS AB 0 CTX 60 (SUTURE) ×3 IMPLANT
SUT PDS AB 1 CT  36 (SUTURE)
SUT PDS AB 1 CT 36 (SUTURE) IMPLANT
SUT VIC AB 1 CTX 36 (SUTURE)
SUT VIC AB 1 CTX36XBRD ANBCTRL (SUTURE) IMPLANT
TOWEL OR 17X24 6PK STRL BLUE (TOWEL DISPOSABLE) ×3 IMPLANT
TRAY FOLEY W/BAG SLVR 14FR LF (SET/KITS/TRAYS/PACK) ×3 IMPLANT
WATER STERILE IRR 1000ML POUR (IV SOLUTION) ×3 IMPLANT

## 2019-06-17 NOTE — Op Note (Signed)
Cesarean Section Procedure Note  Pre-operative Diagnosis: IUP at 36 4/7, PPROM, Labor, Nonreassuring Fetal heart rate  Post-operative Diagnosis: same  Surgeon: Luz Lex   Assistants: surg tech  Anesthesia: epidural  Procedure:  Low Segment Transverse cesarean section  Procedure Details  The patient was seen in the Holding Room. The risks, benefits, complications, treatment options, and expected outcomes were discussed with the patient.  The patient concurred with the proposed plan, giving informed consent.  The site of surgery properly noted/marked.. A Time Out was held and the above information confirmed.  After induction of anesthesia, the patient was draped and prepped in the usual sterile manner. A Pfannenstiel incision was made and carried down through the subcutaneous tissue to the fascia. Fascial incision was made and extended transversely. The fascia was separated from the underlying rectus tissue superiorly and inferiorly. The peritoneum was identified and entered. Peritoneal incision was extended longitudinally. The utero-vesical peritoneal reflection was incised transversely and the bladder flap was bluntly freed from the lower uterine segment. A low transverse uterine incision was made. Delivered from vertes presentation was a baby with Apgar scores were not assigned in the delivery room by NICU team.. After the umbilical cord was clamped and cut cord blood was obtained for evaluation. The placenta was removed intact and appeared normal. The uterine outline, tubes and ovaries appeared normal; no evidence of dermoid seen.  Pt declined option of opening ovary to evaluate. The uterine incision was closed with running locked sutures of 0 monocryl and imbricated with 0 monocryl. Hemostasis was observed. Lavage was carried out until clear. The peritoneum was then closed with 0 monocryl and rectus muscles plicated in the midline.  After hemostasis was assured, the fascia was then  reapproximated with running sutures of 0 PDS. Irrigation was applied and after adequate hemostasis was assured, the skin was reapproximated with subcutaneous sutures using 4-0 monocryl.  Instrument, sponge, and needle counts were correct prior the abdominal closure and at the conclusion of the case. The patient received 2 grams cefotetan preoperatively.  Findings: Viable female  Estimated Blood Loss:  500cc         Specimens: Placenta was sent to labor and delivery         Complications:  None

## 2019-06-17 NOTE — H&P (Signed)
Charlene Reynolds is a 23 y.o. female presenting for Decreased FM, Ctxs and PPROM in MAU.  In labor and delivery she has decreased FHR variability and recurrent late decels.  Preg uncomplicated and GBS unknown. OB History    Gravida  1   Para      Term      Preterm      AB      Living        SAB      TAB      Ectopic      Multiple      Live Births             Past Medical History:  Diagnosis Date  . Allergic rhinitis   . Eczema   . Endometriosis   . Ovarian cyst bilateral   Past Surgical History:  Procedure Laterality Date  . WISDOM TOOTH EXTRACTION     Family History: family history includes Asthma in her mother; Breast cancer in her maternal aunt; Diabetes in her paternal grandfather and paternal grandmother; Hypertension in her mother; Irritable bowel syndrome in her mother. Social History:  reports that she has quit smoking. Her smoking use included cigars. She has never used smokeless tobacco. She reports that she does not drink alcohol or use drugs.     Maternal Diabetes: No Genetic Screening: Normal Maternal Ultrasounds/Referrals: Normal Fetal Ultrasounds or other Referrals:  None Maternal Substance Abuse:  No Significant Maternal Medications:  None Significant Maternal Lab Results:  None Other Comments:  None  ROS History Dilation: 3 Effacement (%): 70 Station: -2 Exam by:: Brooke Pace, RN Blood pressure 132/65, pulse (!) 121, temperature 98.5 F (36.9 C), temperature source Oral, resp. rate 18, height 5\' 1"  (1.549 m), weight 110.2 kg, SpO2 98 %. Exam Physical Exam  Prenatal labs: ABO, Rh: --/--/O POS, O POS Performed at [facility], [place] [address] [place], [place] [address]  [phone]) Antibody: NEG (07/25 0038) Rubella:   RPR:    HBsAg:    HIV:    GBS:     Assessment/Plan: IUP at 36 4/7 with Labor, PPROM and nonreassuring FHR remote from delivery Rec primary C/S Risks and benefits of C/S were discussed.  All  questions were answered and informed consent was obtained.  Plan to proceed with low segment transverse Cesarean Section.   Luz Lex 06/17/2019, 5:18 AM

## 2019-06-17 NOTE — Transfer of Care (Signed)
Immediate Anesthesia Transfer of Care Note  Patient: Charlene Reynolds  Procedure(s) Performed: CESAREAN SECTION (N/A )  Patient Location: PACU  Anesthesia Type:Epidural  Level of Consciousness: awake, alert  and oriented  Airway & Oxygen Therapy: Patient Spontanous Breathing  Post-op Assessment: Report given to RN and Post -op Vital signs reviewed and stable  Post vital signs: Reviewed and stable  Last Vitals:  Vitals Value Taken Time  BP 113/61 06/17/19 0638  Temp 36.9 C 06/17/19 0638  Pulse 117 06/17/19 0641  Resp 17 06/17/19 0638  SpO2 96 % 06/17/19 0641  Vitals shown include unvalidated device data.  Last Pain:  Vitals:   06/17/19 2023  TempSrc: Oral  PainSc: 0-No pain         Complications: No apparent anesthesia complications

## 2019-06-17 NOTE — Anesthesia Preprocedure Evaluation (Signed)
Anesthesia Evaluation  Patient identified by MRN, date of birth, ID band Patient awake    Reviewed: Allergy & Precautions, H&P , NPO status , Patient's Chart, lab work & pertinent test results  Airway Mallampati: III  TM Distance: >3 FB Neck ROM: full    Dental no notable dental hx. (+) Teeth Intact   Pulmonary former smoker,    Pulmonary exam normal breath sounds clear to auscultation       Cardiovascular negative cardio ROS Normal cardiovascular exam Rhythm:regular Rate:Normal     Neuro/Psych negative neurological ROS  negative psych ROS   GI/Hepatic negative GI ROS, Neg liver ROS,   Endo/Other  Morbid obesity  Renal/GU negative Renal ROS     Musculoskeletal   Abdominal (+) + obese,   Peds  Hematology  (+) Blood dyscrasia, anemia ,   Anesthesia Other Findings   Reproductive/Obstetrics (+) Pregnancy                             Anesthesia Physical Anesthesia Plan  ASA: III  Anesthesia Plan: Epidural   Post-op Pain Management:    Induction:   PONV Risk Score and Plan:   Airway Management Planned:   Additional Equipment:   Intra-op Plan:   Post-operative Plan:   Informed Consent: I have reviewed the patients History and Physical, chart, labs and discussed the procedure including the risks, benefits and alternatives for the proposed anesthesia with the patient or authorized representative who has indicated his/her understanding and acceptance.       Plan Discussed with:   Anesthesia Plan Comments:         Anesthesia Quick Evaluation

## 2019-06-17 NOTE — Anesthesia Postprocedure Evaluation (Signed)
Anesthesia Post Note  Patient: Charlene Reynolds  Procedure(s) Performed: CESAREAN SECTION (N/A )     Patient location during evaluation: Mother Baby Anesthesia Type: Epidural Level of consciousness: awake, awake and alert and oriented Pain management: pain level controlled Vital Signs Assessment: post-procedure vital signs reviewed and stable Respiratory status: spontaneous breathing, nonlabored ventilation and respiratory function stable Cardiovascular status: stable Postop Assessment: no headache, no backache, patient able to bend at knees, no apparent nausea or vomiting, adequate PO intake and able to ambulate Anesthetic complications: no    Last Vitals:  Vitals:   06/17/19 1115 06/17/19 1300  BP: 118/80 116/69  Pulse: (!) 110 (!) 108  Resp: 18   Temp: 37.9 C 37.4 C  SpO2: 99% 100%    Last Pain:  Vitals:   06/17/19 1546  TempSrc:   PainSc: 5    Pain Goal:                   Charlene Reynolds

## 2019-06-17 NOTE — Anesthesia Procedure Notes (Signed)
Epidural Patient location during procedure: OB Start time: 06/17/2019 2:10 AM End time: 06/17/2019 2:14 AM  Staffing Anesthesiologist: Lyn Hollingshead, MD Performed: anesthesiologist   Preanesthetic Checklist Completed: patient identified, site marked, surgical consent, pre-op evaluation, timeout performed, IV checked, risks and benefits discussed and monitors and equipment checked  Epidural Patient position: sitting Prep: site prepped and draped and DuraPrep Patient monitoring: continuous pulse ox and blood pressure Approach: midline Location: L3-L4 Injection technique: LOR air  Needle:  Needle type: Tuohy  Needle gauge: 17 G Needle length: 9 cm and 9 Needle insertion depth: 8 cm Catheter type: closed end flexible Catheter size: 19 Gauge Catheter at skin depth: 13 cm Test dose: negative and Other  Assessment Events: blood not aspirated, injection not painful, no injection resistance, negative IV test and no paresthesia  Additional Notes Reason for block:procedure for pain

## 2019-06-17 NOTE — MAU Note (Signed)
Covid swab done without difficulty and pt tol fairly well. No symptoms

## 2019-06-18 ENCOUNTER — Encounter (HOSPITAL_COMMUNITY): Payer: Self-pay

## 2019-06-18 LAB — CBC
HCT: 27.8 % — ABNORMAL LOW (ref 36.0–46.0)
Hemoglobin: 8.9 g/dL — ABNORMAL LOW (ref 12.0–15.0)
MCH: 27.2 pg (ref 26.0–34.0)
MCHC: 32 g/dL (ref 30.0–36.0)
MCV: 85 fL (ref 80.0–100.0)
Platelets: 164 10*3/uL (ref 150–400)
RBC: 3.27 MIL/uL — ABNORMAL LOW (ref 3.87–5.11)
RDW: 14 % (ref 11.5–15.5)
WBC: 18.7 10*3/uL — ABNORMAL HIGH (ref 4.0–10.5)
nRBC: 0.1 % (ref 0.0–0.2)

## 2019-06-18 LAB — ABO/RH: ABO/RH(D): O POS

## 2019-06-18 MED ORDER — FERROUS SULFATE 325 (65 FE) MG PO TABS
325.0000 mg | ORAL_TABLET | Freq: Every day | ORAL | Status: DC
  Administered 2019-06-19 – 2019-06-20 (×2): 325 mg via ORAL
  Filled 2019-06-18 (×2): qty 1

## 2019-06-18 MED ORDER — IBUPROFEN 600 MG PO TABS
600.0000 mg | ORAL_TABLET | Freq: Four times a day (QID) | ORAL | Status: DC | PRN
  Administered 2019-06-18 (×2): 600 mg via ORAL
  Filled 2019-06-18 (×2): qty 1

## 2019-06-18 MED ORDER — ACETAMINOPHEN 500 MG PO TABS
1000.0000 mg | ORAL_TABLET | Freq: Four times a day (QID) | ORAL | Status: DC | PRN
  Administered 2019-06-18 – 2019-06-20 (×7): 1000 mg via ORAL
  Filled 2019-06-18 (×8): qty 2

## 2019-06-18 MED ORDER — IBUPROFEN 800 MG PO TABS
800.0000 mg | ORAL_TABLET | Freq: Three times a day (TID) | ORAL | Status: DC | PRN
  Administered 2019-06-18 – 2019-06-20 (×5): 800 mg via ORAL
  Filled 2019-06-18 (×5): qty 1

## 2019-06-18 NOTE — Lactation Note (Signed)
This note was copied from a baby's chart. Lactation Consultation Note  Patient Name: Charlene Reynolds [id number] Date: 06/18/2019  Attempted to see mom for initial consult.  Mom is in the NICU.  She has started pumping.  Feeding assist scheduled for 1400 in the NICU.  Providing Breastmilk For Your Baby In The NICU book and Breastfeeding Consultation Services handout left at bedside.   Maternal Data    Feeding Feeding Type: Formula Nipple Type: Slow - flow  LATCH Score                   Interventions    Lactation Tools Discussed/Used     Consult Status      Ave Filter 06/18/2019, 1:31 PM

## 2019-06-18 NOTE — Progress Notes (Signed)
Subjective: Postpartum Day 1: Cesarean Delivery Patient reports incisional pain, tolerating PO and no problems voiding.    Objective: Vital signs in last 24 hours: Temp:  [98 F (36.7 C)-100.3 F (37.9 C)] 98.3 F (36.8 C) (07/26 0612) Pulse Rate:  [79-110] 79 (07/26 0612) Resp:  [16-20] 18 (07/26 0612) BP: (106-134)/(52-80) 106/52 (07/26 0612) SpO2:  [97 %-100 %] 100 % (07/26 0612)  Physical Exam:  General: alert, cooperative, appears stated age and no distress Lochia: appropriate Uterine Fundus: firm Incision: healing well DVT Evaluation: No evidence of DVT seen on physical exam.  Recent Labs    06/17/19 0038 06/18/19 0424  HGB 11.2* 8.9*  HCT 35.3* 27.8*    Assessment/Plan: Status post Cesarean section. Doing well postoperatively.  Continue current care Baby in NICU now on Room air Prev A2 DM Anemia - restart PNV and Feso4.  Luz Lex 06/18/2019, 8:57 AM

## 2019-06-18 NOTE — Lactation Note (Signed)
This note was copied from a baby's chart. Lactation Consultation Note  Patient Name: Charlene Reynolds [id number] Date: 06/18/2019 Reason for consult: Initial assessment;Late-preterm 34-36.6wks;1st time breastfeeding;Primapara;NICU baby  Mom requested a NICU consult, visited with 87 hours old LPI NICU female who is being formula fed by gavage and also through an IV. Baby is on Similac 20 calorie formula but mom is already pumping; she started yesterday but getting discouraged because she's not always getting "drops". Explained to mom that the purpose of pumping early on is mainly for breast stimulation and not to get volume, praised her for her efforts. She participated in the [facility] program [facility] the [facility] and she's planning on getting a pump through her insurance, she already called them.   Reviewed hand expression with mom and she was able to teach back and express some colostrum. Mom also wanted assisted with latch, LC took baby STS to mother's right breast in cross cradle position but baby very sleepy, LC tried rubbing his cheek and doing some breast compressions while he held the nipple into his mouth but no sucking reflex elicit, he'd barely suck on a gloved finger, no latch achieved at this point; an attempt was documented in Flowsheets. Reviewed normal newborn behavior and pumping schedule.  Feeding plan:  1. Encouraged mom to pump every 3 hours, at least 8 times/24 hours period 2. If mom is not getting colostrum through pumping, advised hand expression with a spoon instead  BF brochure, BF resources and NICU booklet were already left in the room by previous LC when she visited mom on the 5th floor, mom wasn't in her room at a time, but this [place] made mom aware that BF materials have been left in her room. She reported all questions and concerns were answered, she's aware of [facility] OP services and will call PRN.  Maternal Data Formula Feeding for Exclusion: No Has patient been taught Hand  Expression?: Yes Does the patient have breastfeeding experience prior to this delivery?: No  Feeding Feeding Type: Breast Fed Nipple Type: Slow - flow  LATCH Score Latch: Too sleepy or reluctant, no latch achieved, no sucking elicited.  Audible Swallowing: None  Type of Nipple: Everted at rest and after stimulation(short shafted, but her tissue is very compressible)  Comfort (Breast/Nipple): Soft / non-tender  Hold (Positioning): Assistance needed to correctly position infant at breast and maintain latch.  LATCH Score: 5  Interventions Interventions: Breast feeding basics reviewed;Assisted with latch;Skin to skin;Breast massage;Hand express;Breast compression;Adjust position;Support pillows;Position options;DEBP  Lactation Tools Discussed/Used Tools: Pump Breast pump type: Double-Electric Breast Pump WIC Program: Yes Pump Review: Setup, frequency, and cleaning Initiated by:: RN Date initiated:: 06/17/19   Consult Status Consult Status: PRN    Charlene Reynolds S Charlene Reynolds 06/18/2019, 2:34 PM

## 2019-06-19 MED ORDER — ONDANSETRON 4 MG PO TBDP
8.0000 mg | ORAL_TABLET | Freq: Three times a day (TID) | ORAL | Status: DC | PRN
  Administered 2019-06-19: 19:00:00 8 mg via ORAL
  Filled 2019-06-19: qty 2

## 2019-06-19 MED ORDER — OXYCODONE HCL 5 MG PO TABS
5.0000 mg | ORAL_TABLET | Freq: Four times a day (QID) | ORAL | Status: DC | PRN
  Administered 2019-06-19 – 2019-06-20 (×3): 5 mg via ORAL
  Filled 2019-06-19 (×3): qty 1

## 2019-06-19 NOTE — Lactation Note (Signed)
This note was copied from a baby's chart. Lactation Consultation Note  Patient Name: Charlene Reynolds [id number] Date: 06/19/2019  [name] is 56 hours and in the NICU.  Mom states baby is doing better and tolerating bottle feeds.  Mom is pumping every 3 hours and doing some hand expression.  She is working on obtaining a breast pump from her insurance company.  Reviewed normal late preterm feeding.  Instructed to continue pumping and hand expression to establish a good milk supply.  Encouraged to call for assist/concerns prn.   Maternal Data    Feeding Feeding Type: Formula Nipple Type: Slow - flow  LATCH Score                   Interventions    Lactation Tools Discussed/Used     Consult Status      [name] 06/19/2019, 2:00 PM

## 2019-06-19 NOTE — Progress Notes (Signed)
Patient screened out for psychosocial assessment since none of the following apply: °Psychosocial stressors documented in mother or baby's chart °Gestation less than 32 weeks °Code at delivery  °Infant with anomalies °Please contact the Clinical Social Worker if specific needs arise, by MOB's request, or if MOB scores greater than 9/yes to question 10 on Edinburgh Postpartum Depression Screen. ° °Aleen Marston Boyd-Gilyard, MSW, LCSW °Clinical Social Work °[phone] °  °

## 2019-06-19 NOTE — Progress Notes (Addendum)
Subjective: Postpartum Day 2: Cesarean Delivery Patient reports tolerating PO and no problems voiding.  Baby in NICU.  Objective: Vital signs in last 24 hours: Temp:  [98.2 F (36.8 C)-98.6 F (37 C)] 98.6 F (37 C) (07/27 0553) Pulse Rate:  [91-95] 91 (07/27 0553) Resp:  [16-18] 16 (07/27 0553) BP: (107-126)/(71-72) 107/72 (07/27 0553)  Physical Exam:  General: alert, cooperative and appears stated age Lochia: appropriate Uterine Fundus: firm Incision: healing well, no significant drainage, no dehiscence DVT Evaluation: No evidence of DVT seen on physical exam. Negative Homan's sign. No cords or calf tenderness.  Recent Labs    06/17/19 0038 06/18/19 0424  HGB 11.2* 8.9*  HCT 35.3* 27.8*    Assessment/Plan: Status post Cesarean section. Doing well postoperatively.  Continue current care.  Linda Hedges 06/19/2019, 9:31 AM

## 2019-06-19 NOTE — Progress Notes (Signed)
Allergy list states patient is allergic to hydrocodone but patient states that she just gets nauseous and would rather have the medicine

## 2019-06-19 NOTE — Plan of Care (Signed)
  Problem: Skin Integrity: Goal: Risk for impaired skin integrity will decrease Outcome: Adequate for Discharge   

## 2019-06-20 MED ORDER — IBUPROFEN 600 MG PO TABS: 600.0000 mg | ORAL_TABLET | Freq: Four times a day (QID) | ORAL | 0 refills | Status: DC | PRN

## 2019-06-20 MED ORDER — OXYCODONE HCL 5 MG PO TABS: 5.0000 mg | ORAL_TABLET | Freq: Four times a day (QID) | ORAL | 0 refills | Status: DC | PRN

## 2019-06-20 MED ORDER — FERROUS SULFATE 325 (65 FE) MG PO TABS: 325.0000 mg | ORAL_TABLET | Freq: Every day | ORAL | 1 refills | Status: DC

## 2019-06-20 MED ORDER — ACETAMINOPHEN 500 MG PO TABS: 1000.0000 mg | ORAL_TABLET | Freq: Four times a day (QID) | ORAL | 0 refills | Status: DC | PRN

## 2019-06-20 NOTE — Discharge Summary (Signed)
Obstetric Discharge Summary Reason for Admission: rupture of membranes Prenatal Procedures: none Intrapartum Procedures: cesarean: low cervical, transverse Postpartum Procedures: none Complications-Operative and Postpartum: none Hemoglobin  Date Value Ref Range Status  06/18/2019 8.9 (L) 12.0 - 15.0 g/dL Final    Comment:    REPEATED TO VERIFY   HCT  Date Value Ref Range Status  06/18/2019 27.8 (L) 36.0 - 46.0 % Final    Physical Exam:  General: alert, cooperative and no distress Lochia: appropriate Uterine Fundus: firm Incision: healing well DVT Evaluation: No evidence of DVT seen on physical exam.  Discharge Diagnoses: Premature labor  Discharge Information: Date: 06/20/2019 Activity: pelvic rest Diet: routine Medications: PNV, Ibuprofen and oxycodone, ferrous sulfate Condition: stable Instructions: refer to practice specific booklet Discharge to: home   Newborn Data: Live born female  Birth Weight: 6 lb 10.5 oz (3020 g) APGAR: 6, 8  Newborn Delivery   Birth date/time: 06/17/2020 05:48:00 Delivery type: C-Section, Low Transverse Trial of labor: Yes C-section categorization: Primary      Home with mother.  Charlene Reynolds 06/20/2019, 6:52 AM

## 2019-06-20 NOTE — Lactation Note (Signed)
This note was copied from a baby's chart. Lactation Consultation Note  Patient Name: Charlene Reynolds [id number] Date: 06/20/2019 Reason for consult: Follow-up assessment;Late-preterm 34-36.6wks;Primapara;1st time breastfeeding;NICU baby  P1 mother whose infant is now 46 hours.  This is a LPTI at 36+4 weeks with a CGA of 37+0 weeks and in the NICU.  Reviewed pumping and mother has continued to pump consistently while in the hospital.  She has tried to latch baby to the breast, however, he remains quite sleepy.  Encouraged STS and hand expression before/after pumping to help increase milk supply.  Mother is taking colostrum to the NICU for baby.  Baby has been doing well with bottle feeding and no longer is being tube fed.    Mother has been trying to get in touch with her insurance company but has been unsuccessful.  Stressed the importance of continuing to pump with a DEBP for milk production for NICU infant.  Mother verbalized understanding.  She is also a [place] participant in [facility] and I have faxed a referral so she may obtain a DEBP today after discharge.  She will call the insurance company again this morning to obtain her DEBP as soon as possible.  Engorgement prevention/treatment discussed.  Manual pump provided with instructions for use.  Reminded mother that lactation services are available to her after she is discharged.  Mother appreciative of assistance.  She will call for our services as needed.     Maternal Data Formula Feeding for Exclusion: No Has patient been taught Hand Expression?: Yes Does the patient have breastfeeding experience prior to this delivery?: No  Feeding Feeding Type: Formula Nipple Type: Slow - flow  LATCH Score                   Interventions    Lactation Tools Discussed/Used WIC Program: Yes Pump Review: Setup, frequency, and cleaning Initiated by:: Carlyne Keehan Date initiated:: 06/20/19   Consult Status Consult  Status: Complete    Kesi Perrow R Jadarion Halbig 06/20/2019, 8:21 AM

## 2019-11-01 ENCOUNTER — Other Ambulatory Visit: Payer: Self-pay

## 2019-11-01 DIAGNOSIS — Z20822 Contact with and (suspected) exposure to covid-19: Secondary | ICD-10-CM

## 2019-11-03 LAB — NOVEL CORONAVIRUS, NAA: SARS-CoV-2, NAA: NOT DETECTED

## 2019-12-15 ENCOUNTER — Other Ambulatory Visit: Payer: Self-pay

## 2019-12-15 ENCOUNTER — Ambulatory Visit: Payer: BC Managed Care – PPO | Attending: Internal Medicine

## 2019-12-15 DIAGNOSIS — Z20822 Contact with and (suspected) exposure to covid-19: Secondary | ICD-10-CM

## 2019-12-16 LAB — NOVEL CORONAVIRUS, NAA: SARS-CoV-2, NAA: NOT DETECTED

## 2020-01-19 LAB — OB RESULTS CONSOLE RPR: RPR: NONREACTIVE

## 2020-01-19 LAB — OB RESULTS CONSOLE HEPATITIS B SURFACE ANTIGEN: Hepatitis B Surface Ag: NEGATIVE

## 2020-01-19 LAB — OB RESULTS CONSOLE RUBELLA ANTIBODY, IGM: Rubella: IMMUNE

## 2020-01-19 LAB — OB RESULTS CONSOLE ANTIBODY SCREEN: Antibody Screen: NEGATIVE

## 2020-01-19 LAB — OB RESULTS CONSOLE HIV ANTIBODY (ROUTINE TESTING): HIV: NONREACTIVE

## 2020-01-19 LAB — OB RESULTS CONSOLE ABO/RH: RH Type: POSITIVE

## 2020-03-16 ENCOUNTER — Encounter (HOSPITAL_COMMUNITY): Payer: Self-pay | Admitting: Obstetrics & Gynecology

## 2020-03-16 ENCOUNTER — Inpatient Hospital Stay (HOSPITAL_BASED_OUTPATIENT_CLINIC_OR_DEPARTMENT_OTHER): Payer: BC Managed Care – PPO

## 2020-03-16 ENCOUNTER — Inpatient Hospital Stay (HOSPITAL_COMMUNITY)
Admission: AD | Admit: 2020-03-16 | Discharge: 2020-03-16 | Disposition: A | Discharge status: Home / Self Care | Payer: BC Managed Care – PPO | Source: Ambulatory Visit | Attending: Obstetrics & Gynecology | Admitting: Obstetrics & Gynecology

## 2020-03-16 ENCOUNTER — Other Ambulatory Visit: Payer: Self-pay

## 2020-03-16 DIAGNOSIS — Z3A16 16 weeks gestation of pregnancy: Secondary | ICD-10-CM

## 2020-03-16 DIAGNOSIS — N83201 Unspecified ovarian cyst, right side: Secondary | ICD-10-CM

## 2020-03-16 DIAGNOSIS — R1031 Right lower quadrant pain: Secondary | ICD-10-CM

## 2020-03-16 DIAGNOSIS — Z882 Allergy status to sulfonamides status: Secondary | ICD-10-CM | POA: Diagnosis not present

## 2020-03-16 DIAGNOSIS — Z885 Allergy status to narcotic agent status: Secondary | ICD-10-CM | POA: Insufficient documentation

## 2020-03-16 DIAGNOSIS — O26899 Other specified pregnancy related conditions, unspecified trimester: Secondary | ICD-10-CM | POA: Diagnosis not present

## 2020-03-16 DIAGNOSIS — R109 Unspecified abdominal pain: Secondary | ICD-10-CM | POA: Diagnosis present

## 2020-03-16 DIAGNOSIS — Z87891 Personal history of nicotine dependence: Secondary | ICD-10-CM | POA: Diagnosis not present

## 2020-03-16 DIAGNOSIS — O26892 Other specified pregnancy related conditions, second trimester: Secondary | ICD-10-CM | POA: Diagnosis not present

## 2020-03-16 DIAGNOSIS — R102 Pelvic and perineal pain: Secondary | ICD-10-CM | POA: Insufficient documentation

## 2020-03-16 LAB — URINALYSIS, ROUTINE W REFLEX MICROSCOPIC
Bilirubin Urine: NEGATIVE
Glucose, UA: NEGATIVE mg/dL
Hgb urine dipstick: NEGATIVE
Ketones, ur: NEGATIVE mg/dL
Nitrite: NEGATIVE
Protein, ur: NEGATIVE mg/dL
Specific Gravity, Urine: 1.013 (ref 1.005–1.030)
pH: 7 (ref 5.0–8.0)

## 2020-03-16 MED ORDER — ACETAMINOPHEN 500 MG PO TABS
1000.0000 mg | ORAL_TABLET | Freq: Once | ORAL | Status: AC
  Administered 2020-03-16: 17:00:00 1000 mg via ORAL
  Filled 2020-03-16: qty 2

## 2020-03-16 MED ORDER — COMFORT FIT MATERNITY SUPP LG MISC
1.0000 [IU] | Freq: Every day | 0 refills | Status: DC
Start: 2020-03-16 — End: 2023-01-04

## 2020-03-16 NOTE — MAU Provider Note (Signed)
History     CSN: [id number]  Arrival date and time: 03/16/20 1552   First Provider Initiated Contact with Patient 03/16/20 1634      Chief Complaint  Patient presents with  . Abdominal Pain   Charlene Reynolds is a 24 y.o. G2P1 at [redacted]w[redacted]d by LMP who presents to MAU with complaints of abdominal pain. Patient reports abdominal pain started occurring 2 days ago. Describes as sharp stabbing pain specific to right lower quadrant above her C/S incision, patient reports that pain is constant but gets worse with movement and standing. Reports that she was in the kitchen making breakfast for 76 month old this morning when pain got worse. Patient states the pain is better when she holds her hand for support on her abdomen. Patient denies taking any medication for abdominal pain. Rates pain 5-6/10. Reports pain being worse when she pokes the area but other than that is a aching pain that is more "annoying than debilitating". She denies vaginal bleeding, discharge, recent IC, or problems with the pregnancy. Patient reports having a dermoid cyst of left ovary that was found with last pregnancy ultrasound.    OB History    Gravida  2   Para  1   Term      Preterm  1   AB      Living  1     SAB      TAB      Ectopic      Multiple  0   Live Births  1           Past Medical History:  Diagnosis Date  . Allergic rhinitis   . Eczema   . Endometriosis   . Ovarian cyst bilateral    Past Surgical History:  Procedure Laterality Date  . CESAREAN SECTION N/A 06/17/2019   Procedure: CESAREAN SECTION;  Surgeon: Louretta Shorten, MD;  Location: [facility] [place] LD ORS;  Service: Obstetrics;  Laterality: N/A;  . WISDOM TOOTH EXTRACTION      Family History  Problem Relation Age of Onset  . Asthma Mother   . Hypertension Mother   . Irritable bowel syndrome Mother   . Diabetes Paternal Grandfather   . Diabetes Paternal Grandmother   . Breast cancer Maternal Aunt     Social History   Tobacco Use   . Smoking status: Former Smoker    Types: Cigars  . Smokeless tobacco: Never Used  Substance Use Topics  . Alcohol use: No    Alcohol/week: 0.0 standard drinks  . Drug use: No    Allergies:  Allergies  Allergen Reactions  . Hydrocodone Nausea And Vomiting  . Sulfa Antibiotics Hives    Medications Prior to Admission  Medication Sig Dispense Refill Last Dose  . acetaminophen (TYLENOL) 500 MG tablet Take 2 tablets (1,000 mg total) by mouth every 6 (six) hours as needed for mild pain or moderate pain. 30 tablet 0   . ferrous sulfate 325 (65 FE) MG tablet Take 1 tablet (325 mg total) by mouth daily with breakfast. 30 tablet 1   . ibuprofen (ADVIL) 600 MG tablet Take 1 tablet (600 mg total) by mouth every 6 (six) hours as needed. 30 tablet 0   . oxyCODONE (OXY IR/ROXICODONE) 5 MG immediate release tablet Take 1 tablet (5 mg total) by mouth every 6 (six) hours as needed for moderate pain or severe pain. 20 tablet 0     Review of Systems  Constitutional: Negative.   Respiratory: Negative.   Cardiovascular:  Negative.   Gastrointestinal: Positive for abdominal pain. Negative for constipation, diarrhea, nausea and vomiting.  Genitourinary: Negative.   Musculoskeletal: Negative.   Neurological: Negative.   Psychiatric/Behavioral: Negative.    Physical Exam   Blood pressure 115/72, pulse (!) 107, temperature 99.1 F (37.3 C), temperature source Oral, resp. rate 16, height 5\' 1"  (1.549 m), weight 104.7 kg, SpO2 98 %, unknown if currently breastfeeding.  Physical Exam  Constitutional: She is oriented to person, place, and time. She appears well-developed and well-nourished.  Obese female   Cardiovascular: Normal rate and regular rhythm.  Respiratory: Effort normal and breath sounds normal. No respiratory distress. She has no wheezes.  GI: Soft. Bowel sounds are normal. She exhibits no distension. There is abdominal tenderness. There is no rebound and no guarding.  Tenderness with deep  palpation to right lower quadrant, negative Mcburney's.   Musculoskeletal:        General: Normal range of motion.  Neurological: She is alert and oriented to person, place, and time.  Skin: Skin is warm and dry.  Psychiatric: She has a normal mood and affect. Her behavior is normal. Thought content normal.   FHR 145 by doppler   Dilation: Closed Effacement (%): Thick Cervical Position: Posterior Exam by:: Wende Bushy CNM  MAU Course  Procedures  MDM Orders Placed This Encounter  Procedures  . [place] MFM OB LIMITED  . Urinalysis, Routine w reflex microscopic  [place] ordered to assess for dermoid cyst   Meds ordered this encounter  Medications  . acetaminophen (TYLENOL) tablet 1,000 mg   UA reviewed- rare bacteria, some leuks and hazy appearance, will add on urine culture  Results for orders placed or performed during the hospital encounter of 03/16/20 (from the past 24 hour(s))  Urinalysis, Routine w reflex microscopic     Status: Abnormal   Collection Time: 03/16/20  4:27 PM  Result Value Ref Range   Color, Urine YELLOW YELLOW   APPearance HAZY (A) CLEAR   Specific Gravity, Urine 1.013 1.005 - 1.030   pH 7.0 5.0 - 8.0   Glucose, UA NEGATIVE NEGATIVE mg/dL   Hgb urine dipstick NEGATIVE NEGATIVE   Bilirubin Urine NEGATIVE NEGATIVE   Ketones, ur NEGATIVE NEGATIVE mg/dL   Protein, ur NEGATIVE NEGATIVE mg/dL   Nitrite NEGATIVE NEGATIVE   Leukocytes,Ua SMALL (A) NEGATIVE   RBC / HPF 0-5 0 - 5 RBC/hpf   WBC, UA 6-10 0 - 5 WBC/hpf   Bacteria, UA RARE (A) NONE SEEN   Squamous Epithelial / LPF 0-5 0 - 5   Mucus PRESENT    Discussed with patient UA, reviewed hydration with patient. Patient reports only drinking 1-2 bottles of water per day. Encouraged patient to increase amount of water she consumes to 6 bottles per day, patient verbalizes understanding.   Results of [place] reviewed with patient:  [place] prelim report noted FHR 148, anterior placenta, AFV WNL, CL 4.28cm, normal left ovary and  right ovary with 3.25x2x2 cyst   Reassessment of pain after treatment in MAU with Tylenol, patient reports pain is better,rates pain 2/10.   Discussed reasons to return to MAU. Follow up as scheduled in the office. Return to MAU as needed. Pt stable at time of discharge. Encouraged hydration and use of pregnancy support belt. Rx for belt given to patient at discharge. Discussed RLP, what to expect and reasons to present to MAU.   Assessment and Plan   1. Pain of round ligament during pregnancy   2. Abdominal pain during  pregnancy   3. Cyst of right ovary   4. [redacted] weeks gestation of pregnancy    Discharge home Follow up as scheduled in the office for prenatal care Return to MAU as needed for reasons discussed and/or emergencies  Rx for maternity support belt  Encouraged hydration   Follow-up [place] to.   Why: Follow up as scheduled for prenatal care and return to MAU as needed for emergencies  Contact information: [place] [address] [phone]          Allergies as of 03/16/2020      Reactions   Hydrocodone Nausea And Vomiting   Sulfa Antibiotics Hives      Medication List    STOP taking these medications   ibuprofen 600 MG tablet Commonly known as: ADVIL   oxyCODONE 5 MG immediate release tablet Commonly known as: Oxy IR/ROXICODONE     TAKE these medications   acetaminophen 500 MG tablet Commonly known as: TYLENOL Take 2 tablets (1,000 mg total) by mouth every 6 (six) hours as needed for mild pain or moderate pain.   Comfort Fit Maternity Supp Lg Misc 1 Units by Does not apply route daily.   ferrous sulfate 325 (65 FE) MG tablet Take 1 tablet (325 mg total) by mouth daily with breakfast.       Lajean Manes CNM 03/16/2020, 7:08 PM

## 2020-03-16 NOTE — Discharge Instructions (Signed)

## 2020-03-16 NOTE — MAU Note (Signed)
Charlene Reynolds is a 24 y.o. at [redacted]w[redacted]d here in MAU reporting: for the past 2 days has been having a stabbing pain right above her c/s scar. No bleeding or discharge. No LOF.  Onset of complaint: 2 days  Pain score: 6/10  Vitals:   03/16/20 1609  BP: 115/72  Pulse: (!) 107  Resp: 16  Temp: 99.1 F (37.3 C)  SpO2: 98%     Lab orders placed from triage: UA

## 2020-03-19 LAB — CULTURE, OB URINE

## 2020-07-23 LAB — OB RESULTS CONSOLE GBS: GBS: POSITIVE

## 2020-08-13 ENCOUNTER — Encounter (HOSPITAL_COMMUNITY): Payer: Self-pay

## 2020-08-13 NOTE — Patient Instructions (Signed)
Charolette Bultman  08/13/2020   Your procedure is scheduled on:  08/20/2020  Arrive at [facility] at Entrance C on [address] at [facility]  and [address]. You are invited to use the FREE valet parking or use the Visitor's parking deck.  Pick up the phone at the desk and dial [phone].  Call this number if you have problems the morning of surgery: [phone]  Remember:   Do not eat food:(After Midnight) Desps de medianoche.  Do not drink clear liquids: (After Midnight) Desps de medianoche.  Take these medicines the morning of surgery with A SIP OF WATER:  none   Do not wear jewelry, make-up or nail polish.  Do not wear lotions, powders, or perfumes. Do not wear deodorant.  Do not shave 48 hours prior to surgery.  Do not bring valuables to the hospital.  [facility] is not   responsible for any belongings or valuables brought to the hospital.  Contacts, dentures or bridgework may not be worn into surgery.  Leave suitcase in the car. After surgery it may be brought to your room.  For patients admitted to the hospital, checkout time is 11:00 AM the day of              discharge.      Please read over the following fact sheets that you were given:     Preparing for Surgery

## 2020-08-18 ENCOUNTER — Other Ambulatory Visit: Payer: Self-pay

## 2020-08-18 ENCOUNTER — Other Ambulatory Visit (HOSPITAL_COMMUNITY)
Admission: RE | Admit: 2020-08-18 | Discharge: 2020-08-18 | Disposition: A | Discharge status: Home / Self Care | Payer: BC Managed Care – PPO | Source: Ambulatory Visit | Attending: Obstetrics & Gynecology | Admitting: Obstetrics & Gynecology

## 2020-08-18 DIAGNOSIS — Z01812 Encounter for preprocedural laboratory examination: Secondary | ICD-10-CM | POA: Insufficient documentation

## 2020-08-18 DIAGNOSIS — Z20822 Contact with and (suspected) exposure to covid-19: Secondary | ICD-10-CM | POA: Insufficient documentation

## 2020-08-18 LAB — CBC
HCT: 36.6 % (ref 36.0–46.0)
Hemoglobin: 11.1 g/dL — ABNORMAL LOW (ref 12.0–15.0)
MCH: 24.8 pg — ABNORMAL LOW (ref 26.0–34.0)
MCHC: 30.3 g/dL (ref 30.0–36.0)
MCV: 81.9 fL (ref 80.0–100.0)
Platelets: 186 10*3/uL (ref 150–400)
RBC: 4.47 MIL/uL (ref 3.87–5.11)
RDW: 15.5 % (ref 11.5–15.5)
WBC: 7.8 10*3/uL (ref 4.0–10.5)
nRBC: 0 % (ref 0.0–0.2)

## 2020-08-18 LAB — TYPE AND SCREEN
ABO/RH(D): O POS
Antibody Screen: NEGATIVE

## 2020-08-18 LAB — RPR: RPR Ser Ql: NONREACTIVE

## 2020-08-18 LAB — SARS CORONAVIRUS 2 (TAT 6-24 HRS): SARS Coronavirus 2: NEGATIVE

## 2020-08-18 NOTE — MAU Note (Signed)
Pt here for covid swab and lab draw. Denies symptoms or sick contacts. Swab collected.

## 2020-08-19 ENCOUNTER — Encounter (HOSPITAL_COMMUNITY): Payer: Self-pay | Admitting: Obstetrics and Gynecology

## 2020-08-19 NOTE — Anesthesia Preprocedure Evaluation (Addendum)
Anesthesia Evaluation  Patient identified by MRN, date of birth, ID band Patient awake    Reviewed: Allergy & Precautions, NPO status , Patient's Chart, lab work & pertinent test results  Airway Mallampati: II  TM Distance: >3 FB Neck ROM: Full    Dental no notable dental hx. (+) Teeth Intact   Pulmonary former smoker,    Pulmonary exam normal breath sounds clear to auscultation       Cardiovascular negative cardio ROS Normal cardiovascular exam Rhythm:Regular Rate:Normal     Neuro/Psych negative neurological ROS  negative psych ROS   GI/Hepatic Neg liver ROS, GERD  Medicated,  Endo/Other  Morbid obesity  Renal/GU negative Renal ROS  negative genitourinary   Musculoskeletal Eczema   Abdominal (+) + obese,   Peds  Hematology  (+) anemia ,   Anesthesia Other Findings   Reproductive/Obstetrics (+) Pregnancy Previous C/Section x 1                            Anesthesia Physical Anesthesia Plan  ASA: III  Anesthesia Plan: Spinal   Post-op Pain Management:    Induction:   PONV Risk Score and Plan: 4 or greater and Treatment may vary due to age or medical condition, Ondansetron and Scopolamine patch - Pre-op  Airway Management Planned:   Additional Equipment:   Intra-op Plan:   Post-operative Plan:   Informed Consent: I have reviewed the patients History and Physical, chart, labs and discussed the procedure including the risks, benefits and alternatives for the proposed anesthesia with the patient or authorized representative who has indicated his/her understanding and acceptance.     Dental advisory given  Plan Discussed with: CRNA and Anesthesiologist  Anesthesia Plan Comments:        Anesthesia Quick Evaluation

## 2020-08-20 ENCOUNTER — Inpatient Hospital Stay (HOSPITAL_COMMUNITY): Payer: BC Managed Care – PPO

## 2020-08-20 ENCOUNTER — Encounter (HOSPITAL_COMMUNITY)
Admission: RE | Disposition: A | Discharge status: Home / Self Care | Payer: Self-pay | Source: Home / Self Care | Attending: Obstetrics and Gynecology

## 2020-08-20 ENCOUNTER — Inpatient Hospital Stay (HOSPITAL_COMMUNITY)
Admission: RE | Admit: 2020-08-20 | Discharge: 2020-08-22 | DRG: 788 | Disposition: A | Discharge status: Home / Self Care | Payer: BC Managed Care – PPO | Attending: Obstetrics and Gynecology | Admitting: Obstetrics and Gynecology

## 2020-08-20 ENCOUNTER — Other Ambulatory Visit: Payer: Self-pay

## 2020-08-20 ENCOUNTER — Encounter (HOSPITAL_COMMUNITY): Payer: Self-pay | Admitting: Obstetrics and Gynecology

## 2020-08-20 DIAGNOSIS — O34211 Maternal care for low transverse scar from previous cesarean delivery: Principal | ICD-10-CM | POA: Diagnosis present

## 2020-08-20 DIAGNOSIS — Z87891 Personal history of nicotine dependence: Secondary | ICD-10-CM | POA: Diagnosis not present

## 2020-08-20 DIAGNOSIS — Z20822 Contact with and (suspected) exposure to covid-19: Secondary | ICD-10-CM | POA: Diagnosis present

## 2020-08-20 DIAGNOSIS — O3483 Maternal care for other abnormalities of pelvic organs, third trimester: Secondary | ICD-10-CM | POA: Diagnosis present

## 2020-08-20 DIAGNOSIS — Z3A39 39 weeks gestation of pregnancy: Secondary | ICD-10-CM | POA: Diagnosis not present

## 2020-08-20 DIAGNOSIS — D279 Benign neoplasm of unspecified ovary: Secondary | ICD-10-CM | POA: Diagnosis present

## 2020-08-20 DIAGNOSIS — O99214 Obesity complicating childbirth: Secondary | ICD-10-CM | POA: Diagnosis present

## 2020-08-20 DIAGNOSIS — Z98891 History of uterine scar from previous surgery: Secondary | ICD-10-CM

## 2020-08-20 DIAGNOSIS — O99824 Streptococcus B carrier state complicating childbirth: Secondary | ICD-10-CM | POA: Diagnosis present

## 2020-08-20 SURGERY — Surgical Case
Anesthesia: Spinal

## 2020-08-20 MED ORDER — DEXAMETHASONE SODIUM PHOSPHATE 4 MG/ML IJ SOLN
INTRAMUSCULAR | Status: DC | PRN
  Administered 2020-08-20: 4 mg via INTRAVENOUS

## 2020-08-20 MED ORDER — SODIUM CHLORIDE 0.9 % IV SOLN
2.0000 g | INTRAVENOUS | Status: AC
  Administered 2020-08-20: 2 g via INTRAVENOUS

## 2020-08-20 MED ORDER — SCOPOLAMINE 1 MG/3DAYS TD PT72
1.0000 | MEDICATED_PATCH | Freq: Once | TRANSDERMAL | Status: DC
  Administered 2020-08-20: 1.5 mg via TRANSDERMAL

## 2020-08-20 MED ORDER — SCOPOLAMINE 1 MG/3DAYS TD PT72
MEDICATED_PATCH | TRANSDERMAL | Status: AC
  Filled 2020-08-20: qty 1

## 2020-08-20 MED ORDER — SOD CITRATE-CITRIC ACID 500-334 MG/5ML PO SOLN
30.0000 mL | Freq: Once | ORAL | Status: AC
  Administered 2020-08-20: 30 mL via ORAL

## 2020-08-20 MED ORDER — IBUPROFEN 600 MG PO TABS
600.0000 mg | ORAL_TABLET | Freq: Four times a day (QID) | ORAL | Status: DC | PRN
  Administered 2020-08-21 – 2020-08-22 (×4): 600 mg via ORAL
  Filled 2020-08-20 (×4): qty 1

## 2020-08-20 MED ORDER — FENTANYL CITRATE (PF) 100 MCG/2ML IJ SOLN
INTRAMUSCULAR | Status: DC | PRN
Start: 2020-08-20 — End: 2020-08-20
  Administered 2020-08-20: 15 ug via INTRATHECAL

## 2020-08-20 MED ORDER — SOD CITRATE-CITRIC ACID 500-334 MG/5ML PO SOLN
ORAL | Status: AC
  Filled 2020-08-20: qty 30

## 2020-08-20 MED ORDER — OXYTOCIN-SODIUM CHLORIDE 30-0.9 UT/500ML-% IV SOLN
INTRAVENOUS | Status: DC | PRN
  Administered 2020-08-20: 30 [IU] via INTRAVENOUS

## 2020-08-20 MED ORDER — FENTANYL CITRATE (PF) 100 MCG/2ML IJ SOLN: 25.0000 ug | INTRAMUSCULAR | Status: DC | PRN

## 2020-08-20 MED ORDER — SIMETHICONE 80 MG PO CHEW
80.0000 mg | CHEWABLE_TABLET | Freq: Three times a day (TID) | ORAL | Status: DC
  Administered 2020-08-20 – 2020-08-22 (×6): 80 mg via ORAL
  Filled 2020-08-20 (×6): qty 1

## 2020-08-20 MED ORDER — ONDANSETRON HCL 4 MG/2ML IJ SOLN
INTRAMUSCULAR | Status: AC
  Filled 2020-08-20: qty 2

## 2020-08-20 MED ORDER — OXYTOCIN-SODIUM CHLORIDE 30-0.9 UT/500ML-% IV SOLN: 2.5000 [IU]/h | INTRAVENOUS | Status: AC

## 2020-08-20 MED ORDER — MORPHINE SULFATE (PF) 0.5 MG/ML IJ SOLN
INTRAMUSCULAR | Status: AC
  Filled 2020-08-20: qty 10

## 2020-08-20 MED ORDER — NALOXONE HCL 4 MG/10ML IJ SOLN
1.0000 ug/kg/h | INTRAVENOUS | Status: DC | PRN
  Filled 2020-08-20: qty 5

## 2020-08-20 MED ORDER — TETANUS-DIPHTH-ACELL PERTUSSIS 5-2.5-18.5 LF-MCG/0.5 IM SUSP: 0.5000 mL | Freq: Once | INTRAMUSCULAR | Status: DC

## 2020-08-20 MED ORDER — PHENYLEPHRINE HCL-NACL 20-0.9 MG/250ML-% IV SOLN
INTRAVENOUS | Status: DC | PRN
  Administered 2020-08-20: 60 ug/min via INTRAVENOUS

## 2020-08-20 MED ORDER — OXYTOCIN-SODIUM CHLORIDE 30-0.9 UT/500ML-% IV SOLN
INTRAVENOUS | Status: AC
  Filled 2020-08-20: qty 500

## 2020-08-20 MED ORDER — KETOROLAC TROMETHAMINE 30 MG/ML IJ SOLN
30.0000 mg | Freq: Four times a day (QID) | INTRAMUSCULAR | Status: DC | PRN
  Administered 2020-08-20: 30 mg via INTRAVENOUS

## 2020-08-20 MED ORDER — DIPHENHYDRAMINE HCL 50 MG/ML IJ SOLN
12.5000 mg | INTRAMUSCULAR | Status: DC | PRN
  Administered 2020-08-20: 12.5 mg via INTRAVENOUS

## 2020-08-20 MED ORDER — LACTATED RINGERS IV SOLN: INTRAVENOUS | Status: DC

## 2020-08-20 MED ORDER — SODIUM CHLORIDE 0.9% FLUSH: 3.0000 mL | INTRAVENOUS | Status: DC | PRN

## 2020-08-20 MED ORDER — DEXMEDETOMIDINE (PRECEDEX) IN NS 20 MCG/5ML (4 MCG/ML) IV SYRINGE
PREFILLED_SYRINGE | INTRAVENOUS | Status: DC | PRN
  Administered 2020-08-20 (×2): 4 ug via INTRAVENOUS

## 2020-08-20 MED ORDER — NALBUPHINE HCL 10 MG/ML IJ SOLN: 5.0000 mg | INTRAMUSCULAR | Status: DC | PRN

## 2020-08-20 MED ORDER — ZOLPIDEM TARTRATE 5 MG PO TABS: 5.0000 mg | ORAL_TABLET | Freq: Every evening | ORAL | Status: DC | PRN

## 2020-08-20 MED ORDER — PRENATAL MULTIVITAMIN CH
1.0000 | ORAL_TABLET | Freq: Every day | ORAL | Status: DC
  Administered 2020-08-21: 1 via ORAL
  Filled 2020-08-20: qty 1

## 2020-08-20 MED ORDER — MENTHOL 3 MG MT LOZG: 1.0000 | LOZENGE | OROMUCOSAL | Status: DC | PRN

## 2020-08-20 MED ORDER — SIMETHICONE 80 MG PO CHEW: 80.0000 mg | CHEWABLE_TABLET | ORAL | Status: DC | PRN

## 2020-08-20 MED ORDER — NALBUPHINE HCL 10 MG/ML IJ SOLN: 5.0000 mg | Freq: Once | INTRAMUSCULAR | Status: DC | PRN

## 2020-08-20 MED ORDER — NALOXONE HCL 0.4 MG/ML IJ SOLN: 0.4000 mg | INTRAMUSCULAR | Status: DC | PRN

## 2020-08-20 MED ORDER — OXYCODONE HCL 5 MG PO TABS
5.0000 mg | ORAL_TABLET | ORAL | Status: DC | PRN
  Administered 2020-08-20: 5 mg via ORAL
  Administered 2020-08-21 (×2): 10 mg via ORAL
  Administered 2020-08-21 (×2): 5 mg via ORAL
  Administered 2020-08-21 – 2020-08-22 (×3): 10 mg via ORAL
  Filled 2020-08-20 (×2): qty 1
  Filled 2020-08-20 (×4): qty 2
  Filled 2020-08-20: qty 1
  Filled 2020-08-20: qty 2
  Filled 2020-08-20: qty 1

## 2020-08-20 MED ORDER — ACETAMINOPHEN 500 MG PO TABS
1000.0000 mg | ORAL_TABLET | Freq: Four times a day (QID) | ORAL | Status: DC | PRN
  Administered 2020-08-20 – 2020-08-22 (×6): 1000 mg via ORAL
  Filled 2020-08-20 (×6): qty 2

## 2020-08-20 MED ORDER — FENTANYL CITRATE (PF) 100 MCG/2ML IJ SOLN
INTRAMUSCULAR | Status: AC
  Filled 2020-08-20: qty 2

## 2020-08-20 MED ORDER — DEXAMETHASONE SODIUM PHOSPHATE 4 MG/ML IJ SOLN
INTRAMUSCULAR | Status: AC
  Filled 2020-08-20: qty 1

## 2020-08-20 MED ORDER — CHLORHEXIDINE GLUCONATE 0.12 % MT SOLN
OROMUCOSAL | Status: AC
  Filled 2020-08-20: qty 15

## 2020-08-20 MED ORDER — SENNOSIDES-DOCUSATE SODIUM 8.6-50 MG PO TABS
2.0000 | ORAL_TABLET | ORAL | Status: DC
  Administered 2020-08-21 (×2): 2 via ORAL
  Filled 2020-08-20 (×2): qty 2

## 2020-08-20 MED ORDER — POVIDONE-IODINE 10 % EX SWAB
2.0000 "application " | Freq: Once | CUTANEOUS | Status: AC
  Administered 2020-08-20: 2 via TOPICAL

## 2020-08-20 MED ORDER — KETOROLAC TROMETHAMINE 30 MG/ML IJ SOLN: 30.0000 mg | Freq: Four times a day (QID) | INTRAMUSCULAR | Status: DC | PRN

## 2020-08-20 MED ORDER — MORPHINE SULFATE (PF) 0.5 MG/ML IJ SOLN
INTRAMUSCULAR | Status: DC | PRN
Start: 2020-08-20 — End: 2020-08-20
  Administered 2020-08-20: .15 mg via INTRATHECAL

## 2020-08-20 MED ORDER — DIPHENHYDRAMINE HCL 25 MG PO CAPS: 25.0000 mg | ORAL_CAPSULE | ORAL | Status: DC | PRN

## 2020-08-20 MED ORDER — DIPHENHYDRAMINE HCL 25 MG PO CAPS: 25.0000 mg | ORAL_CAPSULE | Freq: Four times a day (QID) | ORAL | Status: DC | PRN

## 2020-08-20 MED ORDER — ACETAMINOPHEN 325 MG PO TABS
650.0000 mg | ORAL_TABLET | ORAL | Status: DC | PRN
  Administered 2020-08-20: 650 mg via ORAL
  Filled 2020-08-20: qty 2

## 2020-08-20 MED ORDER — SODIUM CHLORIDE 0.9 % IV SOLN: INTRAVENOUS | Status: DC | PRN

## 2020-08-20 MED ORDER — ONDANSETRON HCL 4 MG/2ML IJ SOLN: 4.0000 mg | Freq: Three times a day (TID) | INTRAMUSCULAR | Status: DC | PRN

## 2020-08-20 MED ORDER — ORAL CARE MOUTH RINSE: 15.0000 mL | Freq: Once | OROMUCOSAL | Status: AC

## 2020-08-20 MED ORDER — MEASLES, MUMPS & RUBELLA VAC IJ SOLR: 0.5000 mL | Freq: Once | INTRAMUSCULAR | Status: DC

## 2020-08-20 MED ORDER — WITCH HAZEL-GLYCERIN EX PADS: 1.0000 "application " | MEDICATED_PAD | CUTANEOUS | Status: DC | PRN

## 2020-08-20 MED ORDER — BUPIVACAINE IN DEXTROSE 0.75-8.25 % IT SOLN
INTRATHECAL | Status: DC | PRN
  Administered 2020-08-20: 1.6 mL via INTRATHECAL

## 2020-08-20 MED ORDER — ONDANSETRON HCL 4 MG/2ML IJ SOLN
INTRAMUSCULAR | Status: DC | PRN
  Administered 2020-08-20: 4 mg via INTRAVENOUS

## 2020-08-20 MED ORDER — KETOROLAC TROMETHAMINE 30 MG/ML IJ SOLN
30.0000 mg | Freq: Four times a day (QID) | INTRAMUSCULAR | Status: AC | PRN
  Administered 2020-08-20 – 2020-08-21 (×2): 30 mg via INTRAVENOUS
  Filled 2020-08-20: qty 1

## 2020-08-20 MED ORDER — DEXMEDETOMIDINE (PRECEDEX) IN NS 20 MCG/5ML (4 MCG/ML) IV SYRINGE
PREFILLED_SYRINGE | INTRAVENOUS | Status: AC
  Filled 2020-08-20: qty 5

## 2020-08-20 MED ORDER — PHENYLEPHRINE HCL-NACL 20-0.9 MG/250ML-% IV SOLN
INTRAVENOUS | Status: AC
  Filled 2020-08-20: qty 250

## 2020-08-20 MED ORDER — KETOROLAC TROMETHAMINE 30 MG/ML IJ SOLN
INTRAMUSCULAR | Status: AC
  Filled 2020-08-20: qty 1

## 2020-08-20 MED ORDER — SIMETHICONE 80 MG PO CHEW
80.0000 mg | CHEWABLE_TABLET | ORAL | Status: DC
  Administered 2020-08-21 (×2): 80 mg via ORAL
  Filled 2020-08-20 (×2): qty 1

## 2020-08-20 MED ORDER — KETOROLAC TROMETHAMINE 30 MG/ML IJ SOLN
30.0000 mg | Freq: Four times a day (QID) | INTRAMUSCULAR | Status: AC | PRN
  Filled 2020-08-20 (×2): qty 1

## 2020-08-20 MED ORDER — SODIUM CHLORIDE 0.9 % IV SOLN
INTRAVENOUS | Status: AC
  Filled 2020-08-20: qty 2

## 2020-08-20 MED ORDER — CHLORHEXIDINE GLUCONATE 0.12 % MT SOLN
15.0000 mL | Freq: Once | OROMUCOSAL | Status: AC
  Administered 2020-08-20: 15 mL via OROMUCOSAL

## 2020-08-20 MED ORDER — OXYCODONE-ACETAMINOPHEN 5-325 MG PO TABS: 1.0000 | ORAL_TABLET | ORAL | Status: DC | PRN

## 2020-08-20 MED ORDER — COCONUT OIL OIL: 1.0000 "application " | TOPICAL_OIL | Status: DC | PRN

## 2020-08-20 MED ORDER — ERYTHROMYCIN 5 MG/GM OP OINT
TOPICAL_OINTMENT | OPHTHALMIC | Status: AC
  Filled 2020-08-20: qty 1

## 2020-08-20 MED ORDER — MEPERIDINE HCL 25 MG/ML IJ SOLN: 6.2500 mg | INTRAMUSCULAR | Status: DC | PRN

## 2020-08-20 MED ORDER — IBUPROFEN 600 MG PO TABS: 600.0000 mg | ORAL_TABLET | Freq: Four times a day (QID) | ORAL | Status: DC | PRN

## 2020-08-20 MED ORDER — DIBUCAINE (PERIANAL) 1 % EX OINT: 1.0000 "application " | TOPICAL_OINTMENT | CUTANEOUS | Status: DC | PRN

## 2020-08-20 MED ORDER — DIPHENHYDRAMINE HCL 50 MG/ML IJ SOLN
INTRAMUSCULAR | Status: AC
  Filled 2020-08-20: qty 1

## 2020-08-20 SURGICAL SUPPLY — 37 items
BENZOIN TINCTURE PRP APPL 2/3 (GAUZE/BANDAGES/DRESSINGS) ×3 IMPLANT
CHLORAPREP W/TINT 26ML (MISCELLANEOUS) ×3 IMPLANT
CLAMP CORD UMBIL (MISCELLANEOUS) IMPLANT
CLOSURE STERI STRIP 1/2 X4 (GAUZE/BANDAGES/DRESSINGS) ×3 IMPLANT
CLOSURE STERI-STRIP 1/4X4 (GAUZE/BANDAGES/DRESSINGS) ×3 IMPLANT
CLOTH BEACON ORANGE TIMEOUT ST (SAFETY) ×3 IMPLANT
DRSG OPSITE POSTOP 4X10 (GAUZE/BANDAGES/DRESSINGS) ×3 IMPLANT
ELECT REM PT RETURN 9FT ADLT (ELECTROSURGICAL) ×3
ELECTRODE REM PT RTRN 9FT ADLT (ELECTROSURGICAL) ×1 IMPLANT
EXTENDER TRAXI PANNICULUS (MISCELLANEOUS) ×1 IMPLANT
EXTRACTOR VACUUM M CUP 4 TUBE (SUCTIONS) IMPLANT
EXTRACTOR VACUUM M CUP 4' TUBE (SUCTIONS)
GLOVE BIOGEL PI IND STRL 7.0 (GLOVE) ×1 IMPLANT
GLOVE BIOGEL PI INDICATOR 7.0 (GLOVE) ×2
GLOVE SURG ORTHO 8.0 STRL STRW (GLOVE) ×3 IMPLANT
GOWN STRL REUS W/TWL LRG LVL3 (GOWN DISPOSABLE) ×6 IMPLANT
KIT ABG SYR 3ML LUER SLIP (SYRINGE) ×3 IMPLANT
NEEDLE HYPO 25X5/8 SAFETYGLIDE (NEEDLE) ×3 IMPLANT
NS IRRIG 1000ML POUR BTL (IV SOLUTION) ×3 IMPLANT
PACK C SECTION WH (CUSTOM PROCEDURE TRAY) ×3 IMPLANT
PAD ABD 7.5X8 STRL (GAUZE/BANDAGES/DRESSINGS) ×3 IMPLANT
PAD OB MATERNITY 4.3X12.25 (PERSONAL CARE ITEMS) ×3 IMPLANT
PENCIL SMOKE EVAC W/HOLSTER (ELECTROSURGICAL) ×3 IMPLANT
SPONGE GAUZE 4X4 12PLY STER LF (GAUZE/BANDAGES/DRESSINGS) ×6 IMPLANT
SUT CHROMIC 2 0 CT 1 (SUTURE) ×3 IMPLANT
SUT MNCRL 0 VIOLET CTX 36 (SUTURE) ×3 IMPLANT
SUT MON AB 4-0 PS1 27 (SUTURE) ×3 IMPLANT
SUT MONOCRYL 0 CTX 36 (SUTURE) ×9
SUT PDS AB 1 CT  36 (SUTURE)
SUT PDS AB 1 CT 36 (SUTURE) IMPLANT
SUT PLAIN 2 0 XLH (SUTURE) ×3 IMPLANT
SUT VIC AB 1 CTX 36 (SUTURE)
SUT VIC AB 1 CTX36XBRD ANBCTRL (SUTURE) IMPLANT
TOWEL OR 17X24 6PK STRL BLUE (TOWEL DISPOSABLE) ×3 IMPLANT
TRAXI PANNICULUS EXTENDER (MISCELLANEOUS) ×2
TRAY FOLEY W/BAG SLVR 14FR LF (SET/KITS/TRAYS/PACK) ×3 IMPLANT
WATER STERILE IRR 1000ML POUR (IV SOLUTION) ×3 IMPLANT

## 2020-08-20 NOTE — Lactation Note (Signed)
This note was copied from a baby's chart. Lactation Consultation Note  Patient Name: Charlene Reynolds [id number] Date: 08/20/2020 Reason for consult: Initial assessment;Term  P2 mother whose infant is now 46 hours old.  This is a term baby at 39+0 weeks.  Mother breast fed her first child (now 97 months) for 2 months.  Mother's feeding preference is breast/bottle.  Baby was swaddled and asleep in mother's arms when I arrived.  Mother was excited to see me since she wanted to try to latch her daughter.  Reviewed breast feeding basics with mother.  Reviewed hand expression and mother was able to express colostrum drops which I finger fed back to baby.  Assessed baby's suck to be weak initially, however, baby did progress to sucking strongly on my gloved finger with colostrum drops.    Assisted baby to latch in the football hold on the right breast easily.  She began sucking but required constant stimulation to continue sucking.  Reminded mother that baby was not showing cues so she may not be very interested in breast feeding at this time.  Mother also fed formula (27 mls) approximately 3 1/2 hours earlier.  Educated mother on appropriate volumes for supplementation and left feeding guidelines at the bedside. Observed baby feeding for 5 additional minutes before she pulled off and fell asleep.  Placed her STS on mother's chest.  Encouraged to continue feeding with cues and to offer the breast when baby is showing an interest.  Explained that she did not need any formula.  Mother was afraid she would lose too much weight and that she was hungry.  I reassured her that breast feeding should meet baby's needs at this age and that frequent breast stimulation will help to ensure a good milk supply.  Mother verbalized understanding.  Also recommended lots of breast massage and hand expression.  Mother has a DEBP for home use.  Father present.  Mother will call for latch assistance as needed.  RN in room  and updated.   Maternal Data Formula Feeding for Exclusion: No Has patient been taught Hand Expression?: Yes Does the patient have breastfeeding experience prior to this delivery?: Yes  Feeding Feeding Type: Breast Fed  LATCH Score Latch: Repeated attempts needed to sustain latch, nipple held in mouth throughout feeding, stimulation needed to elicit sucking reflex.  Audible Swallowing: None  Type of Nipple: Everted at rest and after stimulation (short shafted)  Comfort (Breast/Nipple): Soft / non-tender  Hold (Positioning): Assistance needed to correctly position infant at breast and maintain latch.  LATCH Score: 6  Interventions Interventions: Breast feeding basics reviewed;Assisted with latch;Skin to skin;Breast massage;Hand express;Breast compression;Adjust position;Position options;Support pillows  Lactation Tools Discussed/Used     Consult Status Consult Status: Follow-up Date: 08/21/20 Follow-up type: In-patient    Charlene Reynolds 08/20/2020, 7:00 PM

## 2020-08-20 NOTE — H&P (Addendum)
Charlene Reynolds is a 24 y.o. female presenting for repeat c/s.  History of right ovarian dermoid cyst would like removed at the time of repeat.  Pregnancy uncomplicated.  GBS + OB History    Gravida  2   Para  1   Term      Preterm  1   AB      Living  1     SAB      TAB      Ectopic      Multiple  0   Live Births  1          Past Medical History:  Diagnosis Date  . Allergic rhinitis   . Eczema   . Endometriosis   . Ovarian cyst bilateral   Past Surgical History:  Procedure Laterality Date  . CESAREAN SECTION N/A 06/17/2019   Procedure: CESAREAN SECTION;  Surgeon: Louretta Shorten, MD;  Location: [facility] LD ORS;  Service: Obstetrics;  Laterality: N/A;  . WISDOM TOOTH EXTRACTION     Family History: family history includes Asthma in her mother; Breast cancer in her maternal aunt; Diabetes in her paternal grandfather and paternal grandmother; Hypertension in her mother; Irritable bowel syndrome in her mother; Sarcoidosis in her maternal grandmother. Social History:  reports that she has quit smoking. Her smoking use included cigars. She has never used smokeless tobacco. She reports that she does not drink alcohol and does not use drugs.     Maternal Diabetes: No Genetic Screening: Normal Maternal Ultrasounds/Referrals: Normal Fetal Ultrasounds or other Referrals:  None Maternal Substance Abuse:  No Significant Maternal Medications:  None Significant Maternal Lab Results:  Group B Strep positive Other Comments:  None  Review of Systems History   unknown if currently breastfeeding. Exam Physical Exam  Cx Cl /50/-3 Prenatal labs: ABO, Rh: --/--/O POS (09/26 2620) Antibody: NEG (09/26 0823) Rubella: Immune (02/26 0000) RPR: NON REACTIVE (09/26 0823)  HBsAg: Negative (02/26 0000)  HIV: Non-reactive (02/26 0000)  GBS: Positive/-- (08/31 0000)   Assessment/Plan: IUP at 39 weeks Previous c/s desires repeat Risks and benefits of C/S were discussed.  All  questions were answered and informed consent was obtained.  Plan to proceed with low segment transverse Cesarean Section.Risks and benefits of C/S were discussed.  All questions were answered and informed consent was obtained.  Plan to proceed with low segment transverse Cesarean Section. Hx of 3cm possible dermoid cyst wants removed at time of c/s.  Discussed ovarian cystectomy.  R&B discussed and informed consent obtained.   This patient has been seen and examined.   All of her questions were answered.  Labs and vital signs reviewed.  Informed consent has been obtained.  The History and Physical is current. Luz Lex 08/20/2020, 6:08 AM

## 2020-08-20 NOTE — Anesthesia Procedure Notes (Signed)
Spinal  Patient location during procedure: OR Start time: 08/20/2020 7:30 AM End time: 08/20/2020 7:33 AM Staffing Performed: anesthesiologist  Anesthesiologist: Josephine Igo, MD Preanesthetic Checklist Completed: patient identified, IV checked, site marked, risks and benefits discussed, surgical consent, monitors and equipment checked, pre-op evaluation and timeout performed Spinal Block Patient position: sitting Prep: DuraPrep and site prepped and draped Patient monitoring: heart rate, cardiac monitor, continuous pulse ox and blood pressure Approach: midline Location: L4-5 Injection technique: single-shot Needle Needle type: Pencan  Needle gauge: 24 G Needle length: 9 cm Needle insertion depth: 7 cm Assessment Sensory level: T4 Additional Notes Patient tolerated procedure well. Adequate sensory level.

## 2020-08-20 NOTE — Transfer of Care (Signed)
Immediate Anesthesia Transfer of Care Note  Patient: Charlene Reynolds  Procedure(s) Performed: CESAREAN SECTION AND RIGHT OVARIAN CYSTECTOMY (N/A )  Patient Location: PACU  Anesthesia Type:Spinal  Level of Consciousness: awake and alert   Airway & Oxygen Therapy: Patient Spontanous Breathing  Post-op Assessment: Report given to RN and Post -op Vital signs reviewed and stable  Post vital signs: Reviewed and stable  Last Vitals:  Vitals Value Taken Time  BP 93/60 08/20/20 0900  Temp    Pulse 97 08/20/20 0902  Resp 24 08/20/20 0902  SpO2 98 % 08/20/20 0902  Vitals shown include unvalidated device data.  Last Pain:  Vitals:   08/20/20 0623  TempSrc: Oral         Complications: No complications documented.

## 2020-08-20 NOTE — Anesthesia Postprocedure Evaluation (Signed)
Anesthesia Post Note  Patient: Charlene Reynolds  Procedure(s) Performed: CESAREAN SECTION AND RIGHT OVARIAN CYSTECTOMY (N/A )     Patient location during evaluation: PACU Anesthesia Type: Spinal Level of consciousness: oriented and awake and alert Pain management: pain level controlled Vital Signs Assessment: post-procedure vital signs reviewed and stable Respiratory status: spontaneous breathing, respiratory function stable and nonlabored ventilation Cardiovascular status: blood pressure returned to baseline and stable Postop Assessment: no headache, no backache, no apparent nausea or vomiting, spinal receding and patient able to bend at knees Anesthetic complications: no   No complications documented.  Last Vitals:  Vitals:   08/20/20 0930 08/20/20 0945  BP: 111/63 (!) 109/49  Pulse: 95 96  Resp: (!) 21 13  Temp:    SpO2: 98% 99%    Last Pain:  Vitals:   08/20/20 0900  TempSrc: Oral   Pain Goal:    LLE Motor Response: Purposeful movement (08/20/20 0930) LLE Sensation: Tingling (08/20/20 0930) RLE Motor Response: Purposeful movement (08/20/20 0930) RLE Sensation: Tingling (08/20/20 0930)     Epidural/Spinal Function Cutaneous sensation: Tingles (08/20/20 0930), Patient able to flex knees: No (08/20/20 0930), Patient able to lift hips off bed: No (08/20/20 0930), Back pain beyond tenderness at insertion site: No (08/20/20 0930), Progressively worsening motor and/or sensory loss: No (08/20/20 0930), Bowel and/or bladder incontinence post epidural: No (08/20/20 0930)  Kimari Lienhard A.

## 2020-08-20 NOTE — Op Note (Signed)
Cesarean Section Procedure Note  Pre-operative Diagnosis: IUP at 39 weeks, previous c/s for repeat, right dermoid ovarian cyst  Post-operative Diagnosis: same  Surgeon: Luz Lex   Assistants: surg tech  Anesthesia: spinal  Procedure:  Low Segment Transverse cesarean section and right ovarian cystectomy  Procedure Details  The patient was seen in the Holding Room. The risks, benefits, complications, treatment options, and expected outcomes were discussed with the patient.  The patient concurred with the proposed plan, giving informed consent.  The site of surgery properly noted/marked.. A Time Out was held and the above information confirmed.  After induction of anesthesia, the patient was draped and prepped in the usual sterile manner. A Pfannenstiel incision was made and carried down through the subcutaneous tissue to the fascia. Fascial incision was made and extended transversely. The fascia was separated from the underlying rectus tissue superiorly and inferiorly. The peritoneum was identified and entered. Peritoneal incision was extended longitudinally. The utero-vesical peritoneal reflection was incised transversely and the bladder flap was bluntly freed from the lower uterine segment. A low transverse uterine incision was made. Delivered from vertex presentation was a baby with Apgar scores of 8 at one minute and 8 at five minutes. After the umbilical cord was clamped and cut cord blood was obtained for evaluation. The placenta was removed intact and appeared normal.  The uterine incision was closed with running locked sutures of 0 monocryl and imbricated with 0 monocryl. Hemostasis was observed. Lavage was carried out until clearThe uterine outline, tubes and ovaries appeared normal however US showed a right dermoid.  The right ovary was opens with bovie, and a 2cm area with hair and sebaceous fluid was identified and removed using the bovie and metzenbaum scissors.  Hemostasis was  achieved with cautery and the ovary was reapproximated with 2-0 chromic with good hemostasis and normal appearance.. The peritoneum was then closed with 0 monocryl and rectus muscles plicated in the midline.  After hemostasis was assured, the fascia was then reapproximated with running sutures of 0 PDS Irrigation was applied and after adequate hemostasis was assured, the skin was reapproximated with subcutaneous sutures using 4-0 monocryl.  Instrument, sponge, and needle counts were correct prior the abdominal closure and at the conclusion of the case. The patient received 2 grams cefotetan preoperatively.  Findings: Viable female Right ovarian dermoid cyst  Estimated Blood Loss:  450cc         Specimens: Placenta was sent to labor and delivery         Complications:  None

## 2020-08-21 LAB — CBC
HCT: 28.8 % — ABNORMAL LOW (ref 36.0–46.0)
Hemoglobin: 9.1 g/dL — ABNORMAL LOW (ref 12.0–15.0)
MCH: 25.9 pg — ABNORMAL LOW (ref 26.0–34.0)
MCHC: 31.6 g/dL (ref 30.0–36.0)
MCV: 81.8 fL (ref 80.0–100.0)
Platelets: 175 10*3/uL (ref 150–400)
RBC: 3.52 MIL/uL — ABNORMAL LOW (ref 3.87–5.11)
RDW: 15.4 % (ref 11.5–15.5)
WBC: 11.4 10*3/uL — ABNORMAL HIGH (ref 4.0–10.5)
nRBC: 0 % (ref 0.0–0.2)

## 2020-08-21 LAB — BIRTH TISSUE RECOVERY COLLECTION (PLACENTA DONATION)

## 2020-08-21 NOTE — Plan of Care (Signed)
  Problem: Skin Integrity: Goal: Demonstration of wound healing without infection will improve Note: Patient removed pressure dressing in the shower. Honeycomb underneath was saturated with serosanguinous drainage and bottom edge was not intact. Called Dr. Gaetano Net who ordered to change honeycomb. Maxwell Caul, Leretha Dykes Juliaetta

## 2020-08-21 NOTE — Progress Notes (Signed)
Subjective: Postpartum Day 1: Cesarean Delivery Patient reports tolerating PO and no problems voiding.    Objective: Vital signs in last 24 hours: Temp:  [97.8 F (36.6 C)-98.9 F (37.2 C)] 98.6 F (37 C) (09/29 0432) Pulse Rate:  [88-104] 95 (09/29 0432) Resp:  [13-24] 18 (09/29 0432) BP: (93-127)/(49-75) 106/51 (09/29 0432) SpO2:  [97 %-100 %] 100 % (09/29 0432)  Physical Exam:  General: alert, cooperative and no distress Lochia: appropriate Uterine Fundus: firm Incision: healing well DVT Evaluation: No evidence of DVT seen on physical exam.  Recent Labs    08/18/20 0823 08/21/20 0526  HGB 11.1* 9.1*  HCT 36.6 28.8*    Assessment/Plan: Status post Cesarean section. Doing well postoperatively.  Continue current care.  Shon Millet II 08/21/2020, 7:13 AM

## 2020-08-21 NOTE — Progress Notes (Addendum)
CSW received consult for hx of Anxiety and PP Depression.  CSW met with MOB to offer support and complete assessment.    CSW congratulated MOB and FOB in the birth of infant. CSW advised MOB of CSW's role and the reason for CSW coming to speak with her. MOB expressed no mental health hx and reported that she has never been diagnosed with PPD. MOB asked CSW "where did you get that from?". CSW advised MOB that per MOB's [facility] Records it is indicated that MOB had PPD after her last child. MOB expressed that she did not have PPD and doesn't have any  mental health hx. CSW understanding and offered MOB education for the post partum period in the event that MOB begins to display signs or symptoms of PPD, however MOB declined stating "thank you but I don't want to waste your time". CSW understanding and congratulated parents again.    CSW identifies no further need for intervention and no barriers to discharge at this time.    Virgie Dad Kilea Mccarey, MSW, LCSW [facility] [place] [phone]

## 2020-08-22 LAB — SURGICAL PATHOLOGY

## 2020-08-22 MED ORDER — OXYCODONE HCL 5 MG PO TABS: 5.0000 mg | ORAL_TABLET | ORAL | 0 refills | Status: DC | PRN

## 2020-08-22 MED ORDER — IBUPROFEN 600 MG PO TABS: 600.0000 mg | ORAL_TABLET | Freq: Four times a day (QID) | ORAL | 0 refills | Status: DC | PRN

## 2020-08-22 NOTE — Lactation Note (Signed)
This note was copied from a baby's chart. Lactation Consultation Note Baby 56 hrs old. Mom is mainly formula feeding baby. Mom stated she wants baby to get colostrum to get her antibody's and then she will be strictly formula.  Mom has large breast. Colostrum easily expressed. Mom states the baby doesn't like the Lt. Breast that she bites it. But ir doesn't hurt on the Rt. Breast. Assisted in latching. Baby BF great. Lee worked w/mom on hand expression collecting 2.5 ml colostrum. Answered questions mom had.  Praised mom for BF. Mom stated she wasn't pumping because she wasn't getting anything. LC explained normal at this time. Encouraged hand expression after pumping to collect colostrum. Mom getting very sleepy while feeding baby. Suggested mom put up side rail in case she falls asleep while holding baby. Mom refused stating she wouldn't be able to get up. LC suggested get up on other side of bed. Mom stated no she liked that side. Strongly encouraged mom if she got to sleepy please put baby in bassinet or call FOB for assistance. Reported to RN of consult.  Patient Name: Charlene Reynolds [id number] Date: 08/22/2020 Reason for consult: Follow-up assessment;Term   Maternal Data Has patient been taught Hand Expression?: Yes Does the patient have breastfeeding experience prior to this delivery?: Yes  Feeding Feeding Type: Breast Fed  LATCH Score Latch: Grasps breast easily, tongue down, lips flanged, rhythmical sucking.  Audible Swallowing: Spontaneous and intermittent  Type of Nipple: Everted at rest and after stimulation (short shaft)  Comfort (Breast/Nipple): Soft / non-tender  Hold (Positioning): Assistance needed to correctly position infant at breast and maintain latch.  LATCH Score: 9  Interventions Interventions: Breast feeding basics reviewed;Assisted with latch;Breast compression;Adjust position;Breast massage;Support pillows;Position options;Hand  express;Expressed milk  Lactation Tools Discussed/Used     Consult Status Consult Status: Follow-up Date: 08/23/20 Follow-up type: In-patient    Theodoro Kalata 08/22/2020, 4:36 AM

## 2020-08-22 NOTE — Discharge Summary (Signed)
Postpartum Discharge Summary       Patient Name: Charlene Reynolds DOB: [birth year] MRN: [id number]  Date of admission: 08/20/2020 Delivery date:08/20/2020  Delivering provider: Lennie Hummer  Date of discharge: 08/22/2020  Admitting diagnosis: Previous cesarean section [Z98.891] Cesarean delivery delivered [O82] Intrauterine pregnancy: [redacted]w[redacted]d    Secondary diagnosis:  Active Problems:   Previous cesarean section   Cesarean delivery delivered  Additional problems:       Discharge diagnosis: Term Pregnancy Delivered                                              Post partum procedures:  Augmentation:   Complications: None  Hospital course: Sceduled C/S   24y.o. yo G2P1102 at 3108w0das admitted to the hospital 08/20/2020 for scheduled cesarean section with the following indication:Elective Repeat and and ovarian cystectomy.Delivery details are as follows:  Membrane Rupture Time/Date: 8:01 AM ,08/20/2020   Delivery Method:C-Section, Low Transverse  Details of operation can be found in separate operative note.  Patient had an uncomplicated postpartum course.  She is ambulating, tolerating a regular diet, passing flatus, and urinating well. Patient is discharged home in stable condition on  08/22/20        Newborn Data: Birth date:08/20/2020  Birth time:8:01 AM  Gender:Female  Living status:Living  Apgars:8 ,8  Weight:3300 g     Magnesium Sulfate received: No BMZ received: No Rhophylac:N/A MMR:N/A T-DaP:Given prenatally Flu: N/A Transfusion:No  Physical exam  Vitals:   08/21/20 0432 08/21/20 1457 08/21/20 2210 08/22/20 0544  BP: (!) 106/51 115/73 111/72 (!) 110/56  Pulse: 95 95 94 96  Resp: '18 16 18 17  ' Temp: 98.6 F (37 C) 98.6 F (37 C) 98.5 F (36.9 C) 98.4 F (36.9 C)  TempSrc: Oral Oral Oral Oral  SpO2: 100%   100%  Weight:      Height:       General: alert, cooperative and no distress Lochia: appropriate Uterine Fundus: firm Incision: Healing well  with no significant drainage DVT Evaluation: No evidence of DVT seen on physical exam. Labs: Lab Results  Component Value Date   WBC 11.4 (H) 08/21/2020   HGB 9.1 (L) 08/21/2020   HCT 28.8 (L) 08/21/2020   MCV 81.8 08/21/2020   PLT 175 08/21/2020   CMP Latest Ref Rng & Units 06/14/2015  Glucose 65 - 99 mg/dL -  BUN 6 - 20 mg/dL -  Creatinine 0.44 - 1.00 mg/dL -  Sodium 135 - 145 mmol/L -  Potassium 3.5 - 5.1 mmol/L -  Chloride 101 - 111 mmol/L -  CO2 22 - 32 mmol/L -  Calcium 8.9 - 10.3 mg/dL -  Total Protein 6.0 - 8.3 g/dL 7.6  Total Bilirubin 0.2 - 1.2 mg/dL 0.5  Alkaline Phos 47 - 119 U/L 66  AST 0 - 37 U/L 15  ALT 0 - 35 U/L 12   Edinburgh Score: Edinburgh Postnatal Depression Scale Screening Tool 08/21/2020  I have been able to laugh and see the funny side of things. 0  I have looked forward with enjoyment to things. 0  I have blamed myself unnecessarily when things went wrong. 1  I have been anxious or worried for no good reason. 2  I have felt scared or panicky for no good reason. 0  Things have been getting on top of me. 0  I have been so unhappy that I have had difficulty sleeping. 0  I have felt sad or miserable. 0  I have been so unhappy that I have been crying. 0  The thought of harming myself has occurred to me. 0  Edinburgh Postnatal Depression Scale Total 3      After visit meds:  Allergies as of 08/22/2020      Reactions   Hydrocodone Nausea And Vomiting   Other    Unspecified nuts   Sulfa Antibiotics Hives      Medication List    TAKE these medications   acetaminophen 500 MG tablet Commonly known as: TYLENOL Take 2 tablets (1,000 mg total) by mouth every 6 (six) hours as needed for mild pain or moderate pain.   Comfort Fit Maternity Supp Lg Misc 1 Units by Does not apply route daily.   Dupixent 300 MG/2ML prefilled syringe Generic drug: dupilumab Inject 300 mg into the skin every 14 (fourteen) days.   ferrous sulfate 325 (65 FE) MG  tablet Take 1 tablet (325 mg total) by mouth daily with breakfast.   ibuprofen 600 MG tablet Commonly known as: ADVIL Take 1 tablet (600 mg total) by mouth every 6 (six) hours as needed for headache, moderate pain or cramping.   oxyCODONE 5 MG immediate release tablet Commonly known as: Oxy IR/ROXICODONE Take 1-2 tablets (5-10 mg total) by mouth every 4 (four) hours as needed for breakthrough pain (pain).        Discharge home in stable condition Infant Feeding: Breast Infant Disposition:home with mother Discharge instruction: per After Visit Summary and Postpartum booklet. Activity: Advance as tolerated. Pelvic rest for 6 weeks.  Diet: routine diet Anticipated Birth Control: Unsure Postpartum Appointment:6 weeks Additional Postpartum F/U:   Future Appointments:No future appointments. Follow up Visit:      08/22/2020 Luz Lex, MD

## 2021-11-02 ENCOUNTER — Emergency Department (HOSPITAL_BASED_OUTPATIENT_CLINIC_OR_DEPARTMENT_OTHER)
Admission: EM | Admit: 2021-11-02 | Discharge: 2021-11-02 | Disposition: A | Discharge status: Home / Self Care | Payer: BC Managed Care – PPO | Attending: Emergency Medicine | Admitting: Emergency Medicine

## 2021-11-02 ENCOUNTER — Encounter (HOSPITAL_BASED_OUTPATIENT_CLINIC_OR_DEPARTMENT_OTHER): Payer: Self-pay

## 2021-11-02 ENCOUNTER — Emergency Department (HOSPITAL_BASED_OUTPATIENT_CLINIC_OR_DEPARTMENT_OTHER): Payer: BC Managed Care – PPO

## 2021-11-02 ENCOUNTER — Other Ambulatory Visit: Payer: Self-pay

## 2021-11-02 DIAGNOSIS — R112 Nausea with vomiting, unspecified: Secondary | ICD-10-CM | POA: Insufficient documentation

## 2021-11-02 DIAGNOSIS — Z87891 Personal history of nicotine dependence: Secondary | ICD-10-CM | POA: Diagnosis not present

## 2021-11-02 DIAGNOSIS — R197 Diarrhea, unspecified: Secondary | ICD-10-CM | POA: Diagnosis not present

## 2021-11-02 DIAGNOSIS — R1084 Generalized abdominal pain: Secondary | ICD-10-CM | POA: Diagnosis not present

## 2021-11-02 LAB — URINALYSIS, ROUTINE W REFLEX MICROSCOPIC
Bilirubin Urine: NEGATIVE
Glucose, UA: NEGATIVE mg/dL
Hgb urine dipstick: NEGATIVE
Ketones, ur: NEGATIVE mg/dL
Leukocytes,Ua: NEGATIVE
Nitrite: NEGATIVE
Protein, ur: 30 mg/dL — AB
Specific Gravity, Urine: >1.03 — ABNORMAL HIGH (ref 1.005–1.030)
pH: 5.5 (ref 5.0–8.0)

## 2021-11-02 LAB — CBC WITH DIFFERENTIAL/PLATELET
Abs Immature Granulocytes: 0.03 10*3/uL (ref 0.00–0.07)
Basophils Absolute: 0 10*3/uL (ref 0.0–0.1)
Basophils Relative: 0 %
Eosinophils Absolute: 0 10*3/uL (ref 0.0–0.5)
Eosinophils Relative: 0 %
HCT: 43.1 % (ref 36.0–46.0)
Hemoglobin: 14.2 g/dL (ref 12.0–15.0)
Immature Granulocytes: 0 %
Lymphocytes Relative: 11 %
Lymphs Abs: 1.3 10*3/uL (ref 0.7–4.0)
MCH: 27.8 pg (ref 26.0–34.0)
MCHC: 32.9 g/dL (ref 30.0–36.0)
MCV: 84.3 fL (ref 80.0–100.0)
Monocytes Absolute: 0.7 10*3/uL (ref 0.1–1.0)
Monocytes Relative: 6 %
Neutro Abs: 9.5 10*3/uL — ABNORMAL HIGH (ref 1.7–7.7)
Neutrophils Relative %: 83 %
Platelets: 252 10*3/uL (ref 150–400)
RBC: 5.11 MIL/uL (ref 3.87–5.11)
RDW: 13.1 % (ref 11.5–15.5)
WBC: 11.6 10*3/uL — ABNORMAL HIGH (ref 4.0–10.5)
nRBC: 0 % (ref 0.0–0.2)

## 2021-11-02 LAB — URINALYSIS, MICROSCOPIC (REFLEX)

## 2021-11-02 LAB — COMPREHENSIVE METABOLIC PANEL
ALT: 20 U/L (ref 0–44)
AST: 19 U/L (ref 15–41)
Albumin: 4.4 g/dL (ref 3.5–5.0)
Alkaline Phosphatase: 72 U/L (ref 38–126)
Anion gap: 9 (ref 5–15)
BUN: 17 mg/dL (ref 6–20)
CO2: 20 mmol/L — ABNORMAL LOW (ref 22–32)
Calcium: 9.2 mg/dL (ref 8.9–10.3)
Chloride: 105 mmol/L (ref 98–111)
Creatinine, Ser: 0.73 mg/dL (ref 0.44–1.00)
GFR, Estimated: >60 mL/min (ref >60)
Glucose, Bld: 92 mg/dL (ref 70–99)
Potassium: 4 mmol/L (ref 3.5–5.1)
Sodium: 134 mmol/L — ABNORMAL LOW (ref 135–145)
Total Bilirubin: 0.4 mg/dL (ref 0.3–1.2)
Total Protein: 8.4 g/dL — ABNORMAL HIGH (ref 6.5–8.1)

## 2021-11-02 LAB — HCG, SERUM, QUALITATIVE: Preg, Serum: NEGATIVE

## 2021-11-02 LAB — LIPASE, BLOOD: Lipase: 26 U/L (ref 11–51)

## 2021-11-02 MED ORDER — ONDANSETRON 4 MG PO TBDP: 4.0000 mg | ORAL_TABLET | Freq: Three times a day (TID) | ORAL | 0 refills | Status: DC | PRN

## 2021-11-02 MED ORDER — MORPHINE SULFATE (PF) 4 MG/ML IV SOLN
4.0000 mg | Freq: Once | INTRAVENOUS | Status: DC
  Filled 2021-11-02: qty 1

## 2021-11-02 MED ORDER — OMEPRAZOLE 20 MG PO CPDR: 20.0000 mg | DELAYED_RELEASE_CAPSULE | Freq: Every day | ORAL | 0 refills | Status: DC

## 2021-11-02 MED ORDER — SODIUM CHLORIDE 0.9 % IV BOLUS
1000.0000 mL | Freq: Once | INTRAVENOUS | Status: AC
  Administered 2021-11-02: 1000 mL via INTRAVENOUS

## 2021-11-02 MED ORDER — ONDANSETRON HCL 4 MG/2ML IJ SOLN
4.0000 mg | Freq: Once | INTRAMUSCULAR | Status: AC
  Administered 2021-11-02: 4 mg via INTRAVENOUS
  Filled 2021-11-02: qty 2

## 2021-11-02 NOTE — ED Notes (Signed)
Finished PO contrast

## 2021-11-02 NOTE — ED Notes (Signed)
EDPA into see, at BS.   

## 2021-11-02 NOTE — Discharge Instructions (Signed)
Take omeprazole daily as prescribed.  You may continue with your Imodium as needed for diarrhea.  Take Zofran as needed as prescribed for nausea vomiting.  Follow-up with GI, call to schedule appointment.

## 2021-11-02 NOTE — ED Notes (Signed)
Sipping on PO contrast. Alert, NAD, calm, interactive, resps e/u.

## 2021-11-02 NOTE — ED Notes (Signed)
Pt to CT

## 2021-11-02 NOTE — ED Notes (Signed)
Alert, NAD, calm, interactive, continues to drink PO contrast

## 2021-11-02 NOTE — ED Triage Notes (Signed)
Pt reports GI problems past 6 months, today n/v/d, cramping.  Has not been seen by pmd, had scope 2016, told has IBS,   Taking immodium last 8am  continues with alternating n/v/d

## 2021-11-02 NOTE — ED Provider Notes (Signed)
[place] EMERGENCY DEPARTMENT Provider Note   CSN: [id number] Arrival date & time: 11/02/21  1133     History Chief Complaint  Patient presents with   Abdominal Pain    GI issues 72mos    Charlene Reynolds is a 25 y.o. female.  25 year old female presents with complaint of abdominal pain with nausea, vomiting and diarrhea.  Patient reports history of stomach problems when she was in college, was seen by GI and had endoscopy at that time.  States that she lost weight and her symptoms resolved.  Symptoms returned about 7 months ago, states that she has a sulfur burps, followed by diarrhea, followed by vomiting.  Patient has been managing her symptoms with Imodium.  Reports she had a significant bout of pain back on Thanksgiving.  Today woke up with similar symptoms however is having significant abdominal pain with chills.  Reports C-section x2, no other abdominal surgery history.  States that she has gained weight since the birth of her 2 children and feels like this may be contributing to her symptoms.      Past Medical History:  Diagnosis Date   Allergic rhinitis    Eczema    Endometriosis    Ovarian cyst bilateral    Patient Active Problem List   Diagnosis Date Noted   Previous cesarean section 08/20/2020   Cesarean delivery delivered 08/20/2020   Preterm premature rupture of membranes (PPROM) with onset of labor within 24 hours of rupture in third trimester, antepartum 06/17/2019    Past Surgical History:  Procedure Laterality Date   CESAREAN SECTION N/A 06/17/2019   Procedure: CESAREAN SECTION;  Surgeon: Louretta Shorten, MD;  Location: [facility] LD ORS;  Service: Obstetrics;  Laterality: N/A;   CESAREAN SECTION N/A 08/20/2020   Procedure: CESAREAN SECTION AND RIGHT OVARIAN CYSTECTOMY;  Surgeon: Louretta Shorten, MD;  Location: [facility] LD ORS;  Service: Obstetrics;  Laterality: N/A;  Repeat edc  08/27/20 need RNFA   WISDOM TOOTH EXTRACTION       OB History     Gravida   2   Para  2   Term  1   Preterm  1   AB      Living  2      SAB      IAB      Ectopic      Multiple  0   Live Births  2           Family History  Problem Relation Age of Onset   Asthma Mother    Hypertension Mother    Irritable bowel syndrome Mother    Diabetes Paternal Grandfather    Diabetes Paternal Grandmother    Breast cancer Maternal Aunt    Sarcoidosis Maternal Grandmother     Social History   Tobacco Use   Smoking status: Former    Types: Cigars   Smokeless tobacco: Never  Vaping Use   Vaping Use: Never used  Substance Use Topics   Alcohol use: No    Alcohol/week: 0.0 standard drinks   Drug use: No    Home Medications Prior to Admission medications   Medication Sig Start Date End Date Taking? Authorizing Provider  omeprazole (PRILOSEC) 20 MG capsule Take 1 capsule (20 mg total) by mouth daily. 11/02/21 12/02/21 Yes Tacy Learn, PA-C  ondansetron (ZOFRAN-ODT) 4 MG disintegrating tablet Take 1 tablet (4 mg total) by mouth every 8 (eight) hours as needed for nausea or vomiting. 11/02/21  Yes Tacy Learn,  PA-C  acetaminophen (TYLENOL) 500 MG tablet Take 2 tablets (1,000 mg total) by mouth every 6 (six) hours as needed for mild pain or moderate pain. Patient not taking: Reported on 08/09/2020 06/20/19   Everlene Farrier, MD  DUPIXENT 300 MG/2ML prefilled syringe Inject 300 mg into the skin every 14 (fourteen) days. 07/30/20   [provider]  Elastic Bandages & Supports (COMFORT FIT MATERNITY SUPP LG) MISC 1 Units by Does not apply route daily. 03/16/20   Lajean Manes, CNM  ferrous sulfate 325 (65 FE) MG tablet Take 1 tablet (325 mg total) by mouth daily with breakfast. Patient not taking: Reported on 08/09/2020 06/20/19   Everlene Farrier, MD  ibuprofen (ADVIL) 600 MG tablet Take 1 tablet (600 mg total) by mouth every 6 (six) hours as needed for headache, moderate pain or cramping. 08/22/20   Louretta Shorten, MD  oxyCODONE (OXY  IR/ROXICODONE) 5 MG immediate release tablet Take 1-2 tablets (5-10 mg total) by mouth every 4 (four) hours as needed for breakthrough pain (pain). 08/22/20   Louretta Shorten, MD    Allergies    Hydrocodone, Other, and Sulfa antibiotics  Review of Systems   Review of Systems  Constitutional:  Positive for chills. Negative for fever.  Respiratory:  Negative for shortness of breath.   Cardiovascular:  Negative for chest pain.  Gastrointestinal:  Positive for abdominal pain, diarrhea, nausea and vomiting. Negative for blood in stool and constipation.  Genitourinary:  Negative for dysuria and frequency.  Musculoskeletal:  Negative for arthralgias and myalgias.  Skin:  Negative for rash and wound.  Allergic/Immunologic: Negative for immunocompromised state.  Neurological:  Negative for weakness.  Hematological:  Negative for adenopathy.  Psychiatric/Behavioral:  Negative for confusion.   All other systems reviewed and are negative.  Physical Exam Updated Vital Signs BP 123/85 (BP Location: Left Arm)   Pulse 97   Temp 98.2 F (36.8 C) (Oral)   Resp 18   Ht 5\' 1"  (1.549 m)   Wt 90.7 kg   SpO2 100%   BMI 37.79 kg/m   Physical Exam Vitals and nursing note reviewed.  Constitutional:      General: She is not in acute distress.    Appearance: She is well-developed. She is not diaphoretic.  HENT:     Head: Normocephalic and atraumatic.  Cardiovascular:     Rate and Rhythm: Normal rate and regular rhythm.     Heart sounds: Normal heart sounds.  Pulmonary:     Effort: Pulmonary effort is normal.     Breath sounds: Normal breath sounds.  Abdominal:     Palpations: Abdomen is soft.     Tenderness: There is abdominal tenderness in the right lower quadrant. There is no right CVA tenderness or left CVA tenderness.  Skin:    General: Skin is warm and dry.     Findings: No erythema or rash.  Neurological:     Mental Status: She is alert and oriented to person, place, and time.   Psychiatric:        Behavior: Behavior normal.    ED Results / Procedures / Treatments   Labs (all labs ordered are listed, but only abnormal results are displayed) Labs Reviewed  CBC WITH DIFFERENTIAL/PLATELET - Abnormal; Notable for the following components:      Result Value   WBC 11.6 (*)    Neutro Abs 9.5 (*)    All other components within normal limits  COMPREHENSIVE METABOLIC PANEL - Abnormal; Notable for the following  components:   Sodium 134 (*)    CO2 20 (*)    Total Protein 8.4 (*)    All other components within normal limits  URINALYSIS, ROUTINE W REFLEX MICROSCOPIC - Abnormal; Notable for the following components:   Specific Gravity, Urine >1.030 (*)    Protein, ur 30 (*)    All other components within normal limits  URINALYSIS, MICROSCOPIC (REFLEX) - Abnormal; Notable for the following components:   Bacteria, UA MANY (*)    All other components within normal limits  LIPASE, BLOOD  HCG, SERUM, QUALITATIVE    EKG None  Radiology CT Abdomen Pelvis Wo Contrast  Result Date: 11/02/2021 CLINICAL DATA:  Right lower quadrant pain. Nausea, vomiting, diarrhea. EXAM: CT ABDOMEN AND PELVIS WITHOUT CONTRAST TECHNIQUE: Multidetector CT imaging of the abdomen and pelvis was performed following the standard protocol without IV contrast. COMPARISON:  05/18/2015 FINDINGS: Lower chest: Included lung bases are clear.  Heart size is normal. Hepatobiliary: Unremarkable unenhanced appearance of the liver. No focal liver lesion identified. Gallbladder within normal limits. No hyperdense gallstone. No biliary dilatation. Pancreas: Unremarkable. No pancreatic ductal dilatation or surrounding inflammatory changes. Spleen: Normal in size without focal abnormality. Adrenals/Urinary Tract: Adrenal glands are unremarkable. Kidneys are normal, without renal calculi, focal lesion, or hydronephrosis. Bladder is unremarkable. Stomach/Bowel: Stomach is within normal limits. Appendix appears normal  (series 5, image 67). No evidence of bowel wall thickening, distention, or inflammatory changes. Vascular/Lymphatic: No significant vascular findings are present. No enlarged abdominal or pelvic lymph nodes. Reproductive: Anteverted uterus containing IUD. No adnexal abnormality identified. Other: No free fluid. No abdominopelvic fluid collection. No pneumoperitoneum. No abdominal wall hernia. Musculoskeletal: No acute or significant osseous findings. IMPRESSION: No acute abdominopelvic findings. Normal appendix. Electronically Signed   By: Davina Poke D.O.   On: 11/02/2021 14:36    Procedures Procedures   Medications Ordered in ED Medications  morphine 4 MG/ML injection 4 mg (4 mg Intravenous Not Given 11/02/21 1258)  ondansetron (ZOFRAN) injection 4 mg (4 mg Intravenous Given 11/02/21 1258)  sodium chloride 0.9 % bolus 1,000 mL (0 mLs Intravenous Stopped 11/02/21 1404)    ED Course  I have reviewed the triage vital signs and the nursing notes.  Pertinent labs & imaging results that were available during my care of the patient were reviewed by me and considered in my medical decision making (see chart for details).  Clinical Course as of 11/02/21 1455  Sun Nov 02, 9758  [id number] 25 year old female with complaint of abdominal pain with nausea, vomiting, diarrhea as above.  Patient is found to have mild tenderness in the right lower quadrant.  No CVA tenderness.  No guarding or rebound tenderness. Patient is given IV fluids, plan to assess labs and imaging for ectopic pregnancy versus IUP versus appendicitis versus gastroenteritis, SBO. Review of labs, leukocytosis with white count of 11.6 with slight increase in neutrophils.  Urinalysis, have slight protein with many bacteria, no urinary symptoms.  CMP with no significant electrolyte disturbance, normal LFTs normal renal function.  Lipase normal hCG negative.  CT abdomen pelvis with contrast without acute findings. [LM]  1950 Patient feels  better after IV fluids and Zofran. Discussed results with patient, offered reassurance.  Recommend return to ER for worsening or concerning symptoms otherwise follow-up with GI.  We will start omeprazole, given Zofran for her nausea and vomiting. [LM]    Clinical Course User Index [LM] Roque Lias   MDM Rules/Calculators/A&P  Final Clinical Impression(s) / ED Diagnoses Final diagnoses:  Generalized abdominal pain  Nausea vomiting and diarrhea    Rx / DC Orders ED Discharge Orders          Ordered    ondansetron (ZOFRAN-ODT) 4 MG disintegrating tablet  Every 8 hours PRN        11/02/21 1448    omeprazole (PRILOSEC) 20 MG capsule  Daily        11/02/21 1448             Tacy Learn, PA-C 11/02/21 [place], [place], DO 11/02/21 [phone]

## 2021-11-13 ENCOUNTER — Other Ambulatory Visit (HOSPITAL_COMMUNITY): Payer: Self-pay | Admitting: Gastroenterology

## 2021-11-13 ENCOUNTER — Other Ambulatory Visit: Payer: Self-pay | Admitting: Gastroenterology

## 2021-11-13 DIAGNOSIS — R140 Abdominal distension (gaseous): Secondary | ICD-10-CM

## 2021-11-13 DIAGNOSIS — R112 Nausea with vomiting, unspecified: Secondary | ICD-10-CM

## 2021-11-13 DIAGNOSIS — R1084 Generalized abdominal pain: Secondary | ICD-10-CM

## 2021-11-13 DIAGNOSIS — R197 Diarrhea, unspecified: Secondary | ICD-10-CM

## 2021-11-26 ENCOUNTER — Ambulatory Visit (HOSPITAL_COMMUNITY): Payer: Medicaid Other

## 2021-11-26 ENCOUNTER — Encounter (HOSPITAL_COMMUNITY): Payer: Self-pay

## 2021-11-27 ENCOUNTER — Ambulatory Visit (HOSPITAL_COMMUNITY): Payer: Medicaid Other

## 2022-04-14 ENCOUNTER — Ambulatory Visit (HOSPITAL_COMMUNITY)
Admission: RE | Admit: 2022-04-14 | Discharge: 2022-04-14 | Disposition: A | Discharge status: Home / Self Care | Payer: BC Managed Care – PPO | Source: Ambulatory Visit | Attending: Gastroenterology | Admitting: Gastroenterology

## 2022-04-14 DIAGNOSIS — R112 Nausea with vomiting, unspecified: Secondary | ICD-10-CM | POA: Insufficient documentation

## 2022-04-14 DIAGNOSIS — R1084 Generalized abdominal pain: Secondary | ICD-10-CM | POA: Diagnosis present

## 2022-04-14 DIAGNOSIS — R197 Diarrhea, unspecified: Secondary | ICD-10-CM | POA: Diagnosis present

## 2022-04-14 DIAGNOSIS — R140 Abdominal distension (gaseous): Secondary | ICD-10-CM | POA: Diagnosis present

## 2022-04-14 MED ORDER — TECHNETIUM TC 99M SULFUR COLLOID
2.1000 | Freq: Once | INTRAVENOUS | Status: AC | PRN
  Administered 2022-04-14: 2.1 via INTRAVENOUS

## 2022-04-16 ENCOUNTER — Encounter (HOSPITAL_COMMUNITY): Payer: Self-pay

## 2022-04-16 ENCOUNTER — Other Ambulatory Visit (HOSPITAL_COMMUNITY): Payer: Medicaid Other

## 2022-04-27 ENCOUNTER — Other Ambulatory Visit: Payer: Self-pay | Admitting: Gastroenterology

## 2022-04-27 DIAGNOSIS — R112 Nausea with vomiting, unspecified: Secondary | ICD-10-CM

## 2022-04-27 DIAGNOSIS — R142 Eructation: Secondary | ICD-10-CM

## 2022-05-03 ENCOUNTER — Telehealth: Payer: BC Managed Care – PPO | Admitting: Family

## 2022-05-03 ENCOUNTER — Encounter: Payer: Self-pay | Admitting: Family

## 2022-05-03 DIAGNOSIS — W57XXXA Bitten or stung by nonvenomous insect and other nonvenomous arthropods, initial encounter: Secondary | ICD-10-CM

## 2022-05-03 DIAGNOSIS — L509 Urticaria, unspecified: Secondary | ICD-10-CM | POA: Diagnosis not present

## 2022-05-03 MED ORDER — TRIAMCINOLONE ACETONIDE 0.5 % EX OINT: 1.0000 "application " | TOPICAL_OINTMENT | Freq: Two times a day (BID) | CUTANEOUS | 0 refills | Status: DC

## 2022-05-03 NOTE — Progress Notes (Signed)
Virtual Visit Consent   Charlene Reynolds, you are scheduled for a virtual visit with a [facility] provider today. Just as with appointments in the office, your consent must be obtained to participate. Your consent will be active for this visit and any virtual visit you may have with one of our providers in the next 365 days. If you have a MyChart account, a copy of this consent can be sent to you electronically.  As this is a virtual visit, video technology does not allow for your provider to perform a traditional examination. This may limit your provider's ability to fully assess your condition. If your provider identifies any concerns that need to be evaluated in person or the need to arrange testing (such as labs, EKG, etc.), we will make arrangements to do so. Although advances in technology are sophisticated, we cannot ensure that it will always work on either your end or our end. If the connection with a video visit is poor, the visit may have to be switched to a telephone visit. With either a video or telephone visit, we are not always able to ensure that we have a secure connection.  By engaging in this virtual visit, you consent to the provision of healthcare and authorize for your insurance to be billed (if applicable) for the services provided during this visit. Depending on your insurance coverage, you may receive a charge related to this service.  I need to obtain your verbal consent now. Are you willing to proceed with your visit today? Charlene Reynolds has provided verbal consent on 05/03/2022 for a virtual visit (video or telephone). Evelina Dun, FNP  Date: 05/03/2022 3:17 PM  Virtual Visit via Video Note   I, Evelina Dun, connected with  Charlene Reynolds  ([id number], 05/17/1996) on 05/03/22 at  3:00 PM EDT by a video-enabled telemedicine application and verified that I am speaking with the correct person using two identifiers.  Location: Patient: Virtual Visit  Location Patient: Home Provider: Virtual Visit Location Provider: Home Office   I discussed the limitations of evaluation and management by telemedicine and the availability of in person appointments. The patient expressed understanding and agreed to proceed.    History of Present Illness: Charlene Reynolds is a 26 y.o. who identifies as a female who was assigned female at birth, and is being seen today for insect bite. She reports she felt something bite her on Tuesday left medial ankle. She did not see the insect that bite her. States she had mild tenderness and erythemas. Then Friday she woke up with hives on her face. The rash is improving with benadryl.   HPI: HPI  Problems:  Patient Active Problem List   Diagnosis Date Noted   Previous cesarean section 08/20/2020   Cesarean delivery delivered 08/20/2020   Preterm premature rupture of membranes (PPROM) with onset of labor within 24 hours of rupture in third trimester, antepartum 06/17/2019    Allergies:  Allergies  Allergen Reactions   Hydrocodone Nausea And Vomiting   Other     Unspecified nuts   Sulfa Antibiotics Hives   Medications:  Current Outpatient Medications:    triamcinolone ointment (KENALOG) 0.5 %, Apply 1 application  topically 2 (two) times daily., Disp: 60 g, Rfl: 0   DUPIXENT 300 MG/2ML prefilled syringe, Inject 300 mg into the skin every 14 (fourteen) days., Disp: , Rfl:    Elastic Bandages & Supports (COMFORT FIT MATERNITY SUPP LG) MISC, 1 Units by Does not apply route daily., Disp: 1 each,  Rfl: 0   ferrous sulfate 325 (65 FE) MG tablet, Take 1 tablet (325 mg total) by mouth daily with breakfast. (Patient not taking: Reported on 08/09/2020), Disp: 30 tablet, Rfl: 1   ibuprofen (ADVIL) 600 MG tablet, Take 1 tablet (600 mg total) by mouth every 6 (six) hours as needed for headache, moderate pain or cramping., Disp: 30 tablet, Rfl: 0   omeprazole (PRILOSEC) 20 MG capsule, Take 1 capsule (20 mg total) by mouth  daily., Disp: 30 capsule, Rfl: 0   ondansetron (ZOFRAN-ODT) 4 MG disintegrating tablet, Take 1 tablet (4 mg total) by mouth every 8 (eight) hours as needed for nausea or vomiting., Disp: 12 tablet, Rfl: 0  Observations/Objective: Patient is well-developed, well-nourished in no acute distress.  Resting comfortably  at home.  Head is normocephalic, atraumatic.  No labored breathing.  Speech is clear and coherent with logical content.  Patient is alert and oriented at baseline.  Small erythemas papule on left medial ankle Dry, erythemas rash on face  Assessment and Plan: 1. Insect bite, unspecified site, initial encounter - triamcinolone ointment (KENALOG) 0.5 %; Apply 1 application  topically 2 (two) times daily.  Dispense: 60 g; Refill: 0  2. Hives  Kenalog cream BID Avoid scratching  Cool compresses  Benadryl as needed Follow up if symptoms worsen or do not improve   Follow Up Instructions: I discussed the assessment and treatment plan with the patient. The patient was provided an opportunity to ask questions and all were answered. The patient agreed with the plan and demonstrated an understanding of the instructions.  A copy of instructions were sent to the patient via MyChart unless otherwise noted below.    The patient was advised to call back or seek an in-person evaluation if the symptoms worsen or if the condition fails to improve as anticipated.  Time:  I spent 12 minutes with the patient via telehealth technology discussing the above problems/concerns.    Evelina Dun, FNP

## 2022-05-03 NOTE — Patient Instructions (Signed)
Contact Dermatitis Dermatitis is redness, soreness, and swelling (inflammation) of the skin. Contact dermatitis is a reaction to certain substances that touch the skin. Many different substances can cause contact dermatitis. There are two types of contact dermatitis: Irritant contact dermatitis. This type is caused by something that irritates your skin, such as having dry hands from washing them too often with soap. This type does not require previous exposure to the substance for a reaction to occur. This is the most common type. Allergic contact dermatitis. This type is caused by a substance that you are allergic to, such as poison ivy. This type occurs when you have been exposed to the substance (allergen) and develop a sensitivity to it. Dermatitis may develop soon after your first exposure to the allergen, or it may not develop until the next time you are exposed and every time thereafter. What are the causes? Irritant contact dermatitis is most commonly caused by exposure to: Makeup. Soaps. Detergents. Bleaches. Acids. Metal salts, such as nickel. Allergic contact dermatitis is most commonly caused by exposure to: Poisonous plants. Chemicals. Jewelry. Latex. Medicines. Preservatives in products, such as clothing. What increases the risk? You are more likely to develop this condition if you have: A job that exposes you to irritants or allergens. Certain medical conditions, such as asthma or eczema. What are the signs or symptoms? Symptoms of this condition may occur on your body anywhere the irritant has touched you or is touched by you. Symptoms include: Dryness or flaking. Redness. Cracks. Itching. Pain or a burning feeling. Blisters. Drainage of small amounts of blood or clear fluid from skin cracks. With allergic contact dermatitis, there may also be swelling in areas such as the eyelids, mouth, or genitals. How is this diagnosed? This condition is diagnosed with a medical  history and physical exam. A patch skin test may be performed to help determine the cause. If the condition is related to your job, you may need to see an occupational medicine specialist. How is this treated? This condition is treated by checking for the cause of the reaction and protecting your skin from further contact. Treatment may also include: Steroid creams or ointments. Oral steroid medicines may be needed in more severe cases. Antibiotic medicines or antibacterial ointments, if a skin infection is present. Antihistamine lotion or an antihistamine taken by mouth to ease itching. A bandage (dressing). Follow these instructions at home: Skin care Moisturize your skin as needed. Apply cool compresses to the affected areas. Try applying baking soda paste to your skin. Stir water into baking soda until it reaches a paste-like consistency. Do not scratch your skin, and avoid friction to the affected area. Avoid the use of soaps, perfumes, and dyes. Medicines Take or apply over-the-counter and prescription medicines only as told by your health care provider. If you were prescribed an antibiotic medicine, take or apply the antibiotic as told by your health care provider. Do not stop using the antibiotic even if your condition improves. Bathing Try taking a bath with: Epsom salts. Follow the instructions on the packaging. You can get these at your local pharmacy or grocery store. Baking soda. Pour a small amount into the bath as directed by your health care provider. Colloidal oatmeal. Follow the instructions on the packaging. You can get this at your local pharmacy or grocery store. Bathe less frequently, such as every other day. Bathe in lukewarm water. Avoid using hot water. Bandage care If you were given a bandage (dressing), change it as told   by your health care provider. Wash your hands with soap and water before and after you change your dressing. If soap and water are not  available, use hand sanitizer. General instructions Avoid the substance that caused your reaction. If you do not know what caused it, keep a journal to try to track what caused it. Write down: What you eat. What cosmetic products you use. What you drink. What you wear in the affected area. This includes jewelry. Check the affected areas every day for signs of infection. Check for: More redness, swelling, or pain. More fluid or blood. Warmth. Pus or a bad smell. Keep all follow-up visits as told by your health care provider. This is important. Contact a health care provider if: Your condition does not improve with treatment. Your condition gets worse. You have signs of infection such as swelling, tenderness, redness, soreness, or warmth in the affected area. You have a fever. You have new symptoms. Get help right away if: You have a severe headache, neck pain, or neck stiffness. You vomit. You feel very sleepy. You notice red streaks coming from the affected area. Your bone or joint underneath the affected area becomes painful after the skin has healed. The affected area turns darker. You have difficulty breathing. Summary Dermatitis is redness, soreness, and swelling (inflammation) of the skin. Contact dermatitis is a reaction to certain substances that touch the skin. Symptoms of this condition may occur on your body anywhere the irritant has touched you or is touched by you. This condition is treated by figuring out what caused the reaction and protecting your skin from further contact. Treatment may also include medicines and skin care. Avoid the substance that caused your reaction. If you do not know what caused it, keep a journal to try to track what caused it. Contact a health care provider if your condition gets worse or you have signs of infection such as swelling, tenderness, redness, soreness, or warmth in the affected area. This information is not intended to replace  advice given to you by your health care provider. Make sure you discuss any questions you have with your health care provider. Document Revised: 08/25/2021 Document Reviewed: 08/25/2021 Elsevier Patient Education  2023 Else[employer] Inc.  

## 2022-05-04 ENCOUNTER — Ambulatory Visit
Admission: RE | Admit: 2022-05-04 | Discharge: 2022-05-04 | Disposition: A | Discharge status: Home / Self Care | Payer: BC Managed Care – PPO | Source: Ambulatory Visit | Attending: Gastroenterology | Admitting: Gastroenterology

## 2022-05-04 DIAGNOSIS — R142 Eructation: Secondary | ICD-10-CM

## 2022-05-04 DIAGNOSIS — R112 Nausea with vomiting, unspecified: Secondary | ICD-10-CM

## 2023-01-04 ENCOUNTER — Ambulatory Visit (INDEPENDENT_AMBULATORY_CARE_PROVIDER_SITE_OTHER): Payer: BC Managed Care – PPO | Admitting: Family Medicine

## 2023-01-04 ENCOUNTER — Encounter: Payer: Self-pay | Admitting: Family Medicine

## 2023-01-04 VITALS — BP 130/87 | HR 97 | Ht 61.5 in | Wt 245.1 lb

## 2023-01-04 DIAGNOSIS — E7849 Other hyperlipidemia: Secondary | ICD-10-CM

## 2023-01-04 DIAGNOSIS — E0789 Other specified disorders of thyroid: Secondary | ICD-10-CM

## 2023-01-04 DIAGNOSIS — Z1159 Encounter for screening for other viral diseases: Secondary | ICD-10-CM

## 2023-01-04 DIAGNOSIS — E559 Vitamin D deficiency, unspecified: Secondary | ICD-10-CM | POA: Diagnosis not present

## 2023-01-04 DIAGNOSIS — R7301 Impaired fasting glucose: Secondary | ICD-10-CM

## 2023-01-04 DIAGNOSIS — E663 Overweight: Secondary | ICD-10-CM

## 2023-01-04 NOTE — Assessment & Plan Note (Signed)
Reports increased weight gain since having her baby She admits to binge eating She reports increased physical activities with minimal changes in her weight Will assess thyroid levels today Encouraged lifestyle modification with increased physical activity and a heart healthy diet Referral placed to a licensed dietitian We will follow up on patient's weight in 3 months Wt Readings from Last 3 Encounters:  01/04/23 245 lb 1.3 oz (111.2 kg)  11/02/21 200 lb (90.7 kg)  08/20/20 239 lb 15 oz (108.8 kg)

## 2023-01-04 NOTE — Progress Notes (Signed)
New Patient Office Visit  Subjective:  Patient ID: Charlene Reynolds, female    DOB: April 24, 1996  Age: 27 y.o. MRN: [id number]  CC:  Chief Complaint  Patient presents with   Establish Care    Pt would like to have general labs. Has concerns about her weight, Gastro recommended for her to establish with pcp to help out with this. Has been going to the gym and is unable to lose weight.     HPI Charlene Reynolds is a 27 y.o. female with past medical history of overweight presents for establishing care. For the details of today's visit, please refer to the assessment and plan.     Past Medical History:  Diagnosis Date   Allergic rhinitis    Eczema    Endometriosis    Ovarian cyst bilateral    Past Surgical History:  Procedure Laterality Date   CESAREAN SECTION N/A 06/17/2019   Procedure: CESAREAN SECTION;  Surgeon: Louretta Shorten, MD;  Location: [facility];  Service: Obstetrics;  Laterality: N/A;   CESAREAN SECTION N/A 08/20/2020   Procedure: CESAREAN SECTION AND RIGHT OVARIAN CYSTECTOMY;  Surgeon: Louretta Shorten, MD;  Location: [facility];  Service: Obstetrics;  Laterality: N/A;  Repeat edc  08/27/20 need RNFA   WISDOM TOOTH EXTRACTION      Family History  Problem Relation Age of Onset   Asthma Mother    Hypertension Mother    Irritable bowel syndrome Mother    Diabetes Paternal Grandfather    Diabetes Paternal Grandmother    Breast cancer Maternal Aunt    Sarcoidosis Maternal Grandmother     Social History   Socioeconomic History   Marital status: Married    Spouse name: Not on file   Number of children: Not on file   Years of education: Not on file   Highest education level: Not on file  Occupational History   Occupation: health care    Occupation: Scientist, water quality   Tobacco Use   Smoking status: Former    Types: Cigars   Smokeless tobacco: Never  Scientific laboratory technician Use: Never used  Substance and Sexual Activity   Alcohol use: No    Alcohol/week: 0.0 standard  drinks of alcohol   Drug use: No   Sexual activity: Yes    Birth control/protection: I.U.D.  Other Topics Concern   Not on file  Social History Narrative   Not on file   Social Determinants of Health   Financial Resource Strain: Not on file  Food Insecurity: Not on file  Transportation Needs: Not on file  Physical Activity: Not on file  Stress: Not on file  Social Connections: Not on file  Intimate Partner Violence: Not on file    ROS Review of Systems  Constitutional:  Negative for chills and fever.  Eyes:  Negative for visual disturbance.  Respiratory:  Negative for chest tightness and shortness of breath.   Neurological:  Negative for dizziness and headaches.    Objective:   Today's Vitals: BP 130/87   Pulse 97   Ht 5' 1.5" (1.562 m)   Wt 245 lb 1.3 oz (111.2 kg)   SpO2 96%   BMI 45.56 kg/m   Physical Exam Constitutional:      Appearance: She is obese.  HENT:     Head: Normocephalic.     Mouth/Throat:     Mouth: Mucous membranes are moist.  Cardiovascular:     Rate and Rhythm: Normal rate.     Heart sounds:  Normal heart sounds.  Pulmonary:     Effort: Pulmonary effort is normal.     Breath sounds: Normal breath sounds.  Neurological:     Mental Status: She is alert.      Assessment & Plan:   Overweight Assessment & Plan: Reports increased weight gain since having her baby She admits to binge eating She reports increased physical activities with minimal changes in her weight Will assess thyroid levels today Encouraged lifestyle modification with increased physical activity and a heart healthy diet Referral placed to a licensed dietitian We will follow up on patient's weight in 3 months Wt Readings from Last 3 Encounters:  01/04/23 245 lb 1.3 oz (111.2 kg)  11/02/21 200 lb (90.7 kg)  08/20/20 239 lb 15 oz (108.8 kg)     Orders: -     Amb ref to Medical Nutrition Therapy-MNT  Vitamin D deficiency -     VITAMIN D 25 Hydroxy (Vit-D  Deficiency, Fractures)  Impaired fasting blood sugar -     Hemoglobin A1c  Other hyperlipidemia -     CBC with Differential/Platelet -     CMP14+EGFR -     Lipid panel  Other specified disorders of thyroid -     TSH + free T4  Need for hepatitis C screening test -     Hepatitis C antibody     Follow-up: Return in about 3 months (around 04/04/2023).   Alvira Monday, FNP

## 2023-01-04 NOTE — Patient Instructions (Addendum)
I appreciate the opportunity to provide care to you today!    Follow up:  3 months  Labs: please stop by the lab during the week to get your blood drawn (CBC, CMP, TSH, Lipid profile, HgA1c, Vit D)  Screening: Hep C    Referrals today-  dietitian   Please continue to a heart-healthy diet and increase your physical activities. Try to exercise for 20mns at least five times a week.   Physical activity helps: Lower your blood glucose, improve your heart health, lower your blood pressure and cholesterol, burn calories to help manage her weight, gave you energy, lower stress, and improve his sleep.  The American diabetes Association (ADA) recommends being active for 2-1/2 hours (150 minutes) or more week.  Exercise for 30 minutes, 5 days a week (150 minutes total)   Eat three meals per day at times discussed. Cut out all diet bevergages and drink only water Eat whole food plant based meals Cut out junk food, fast food and processed foods Exercise 150 minutes a week Lose 1-2 lbs per week. Keep a food journal Choose foods that grow in a garden or in a fruit orchard and protein of animals with fins or feathers.  Lifestyle Medicine  - Whole Food, Plant Predominant Nutrition is highly recommended: Eat Plenty of vegetables, Mushrooms, fruits, Legumes, Whole Grains, Nuts, seeds in lieu of processed meats, processed snacks/pastries red meat, poultry, eggs.   -It is better to avoid simple carbohydrates including: Cakes, Sweet Desserts, Ice Cream, Soda (diet and regular), Sweet Tea, Candies, Chips, Cookies, Store Bought Juices, Alcohol in Excess of  1-2 drinks a day, Lemonade,  Artificial Sweeteners, Doughnuts, Coffee Creamers, "Sugar-free" Products, etc, etc.  This is not a complete list..... Exercise: If you are able: 30 -60 minutes a day ,4 days a week, or 150 minutes a week.  The longer the better.  Combine stretch, strength, and aerobic activities.  If you were told in the past that you have high  risk for cardiovascular diseases, you may seek evaluation by your heart doctor prior to initiating moderate to intense exercise programs.     It was a pleasure to see you and I look forward to continuing to work together on your health and well-being. Please do not hesitate to call the office if you need care or have questions about your care.   Have a wonderful day and week. With Gratitude, GAlvira MondayMSN, FNP-BC

## 2023-02-04 ENCOUNTER — Ambulatory Visit: Payer: BC Managed Care – PPO | Admitting: Nutrition

## 2023-02-05 LAB — CBC WITH DIFFERENTIAL/PLATELET
Basophils Absolute: 0 10*3/uL (ref 0.0–0.2)
Basos: 1 %
EOS (ABSOLUTE): 0 10*3/uL (ref 0.0–0.4)
Eos: 1 %
Hematocrit: 40.2 % (ref 34.0–46.6)
Hemoglobin: 13.5 g/dL (ref 11.1–15.9)
Immature Grans (Abs): 0 10*3/uL (ref 0.0–0.1)
Immature Granulocytes: 0 %
Lymphocytes Absolute: 2.4 10*3/uL (ref 0.7–3.1)
Lymphs: 43 %
MCH: 28.5 pg (ref 26.6–33.0)
MCHC: 33.6 g/dL (ref 31.5–35.7)
MCV: 85 fL (ref 79–97)
Monocytes Absolute: 0.4 10*3/uL (ref 0.1–0.9)
Monocytes: 7 %
Neutrophils Absolute: 2.7 10*3/uL (ref 1.4–7.0)
Neutrophils: 48 %
Platelets: 237 10*3/uL (ref 150–450)
RBC: 4.73 x10E6/uL (ref 3.77–5.28)
RDW: 12.5 % (ref 11.7–15.4)
WBC: 5.5 10*3/uL (ref 3.4–10.8)

## 2023-02-05 LAB — CMP14+EGFR
ALT: 19 IU/L (ref 0–32)
AST: 17 IU/L (ref 0–40)
Albumin/Globulin Ratio: 1.7 (ref 1.2–2.2)
Albumin: 4.5 g/dL (ref 4.0–5.0)
Alkaline Phosphatase: 72 IU/L (ref 44–121)
BUN/Creatinine Ratio: 16 (ref 9–23)
BUN: 12 mg/dL (ref 6–20)
Bilirubin Total: 0.3 mg/dL (ref 0.0–1.2)
CO2: 19 mmol/L — ABNORMAL LOW (ref 20–29)
Calcium: 9.2 mg/dL (ref 8.7–10.2)
Chloride: 103 mmol/L (ref 96–106)
Creatinine, Ser: 0.77 mg/dL (ref 0.57–1.00)
Globulin, Total: 2.7 g/dL (ref 1.5–4.5)
Glucose: 84 mg/dL (ref 70–99)
Potassium: 4.2 mmol/L (ref 3.5–5.2)
Sodium: 137 mmol/L (ref 134–144)
Total Protein: 7.2 g/dL (ref 6.0–8.5)
eGFR: 108 mL/min/{1.73_m2} (ref >59)

## 2023-02-05 LAB — LIPID PANEL
Chol/HDL Ratio: 3.6 ratio (ref 0.0–4.4)
Cholesterol, Total: 211 mg/dL — ABNORMAL HIGH (ref 100–199)
HDL: 58 mg/dL (ref >39)
LDL Chol Calc (NIH): 139 mg/dL — ABNORMAL HIGH (ref 0–99)
Triglycerides: 80 mg/dL (ref 0–149)
VLDL Cholesterol Cal: 14 mg/dL (ref 5–40)

## 2023-02-05 LAB — HEMOGLOBIN A1C
Est. average glucose Bld gHb Est-mCnc: 117 mg/dL
Hgb A1c MFr Bld: 5.7 % — ABNORMAL HIGH (ref 4.8–5.6)

## 2023-02-05 LAB — HEPATITIS C ANTIBODY: Hep C Virus Ab: NONREACTIVE

## 2023-02-05 LAB — TSH+FREE T4
Free T4: 1.19 ng/dL (ref 0.82–1.77)
TSH: 0.786 u[IU]/mL (ref 0.450–4.500)

## 2023-02-05 LAB — VITAMIN D 25 HYDROXY (VIT D DEFICIENCY, FRACTURES): Vit D, 25-Hydroxy: 8.7 ng/mL — ABNORMAL LOW (ref 30.0–100.0)

## 2023-02-09 ENCOUNTER — Other Ambulatory Visit: Payer: Self-pay | Admitting: Family Medicine

## 2023-02-23 ENCOUNTER — Encounter: Payer: Self-pay | Admitting: Family Medicine

## 2023-03-15 ENCOUNTER — Ambulatory Visit: Payer: BC Managed Care – PPO | Admitting: Nutrition

## 2023-03-23 IMAGING — US US ABDOMEN LIMITED
1 series · 14 of 25 positions shown · non-contrast
Comparison: None Available.

CLINICAL DATA: Nausea and vomiting

EXAM:
ULTRASOUND ABDOMEN LIMITED RIGHT UPPER QUADRANT

[Series 1: us abdomen limited · 0.19mm/px · 14 of 41 slices shown]
[im 1/41]
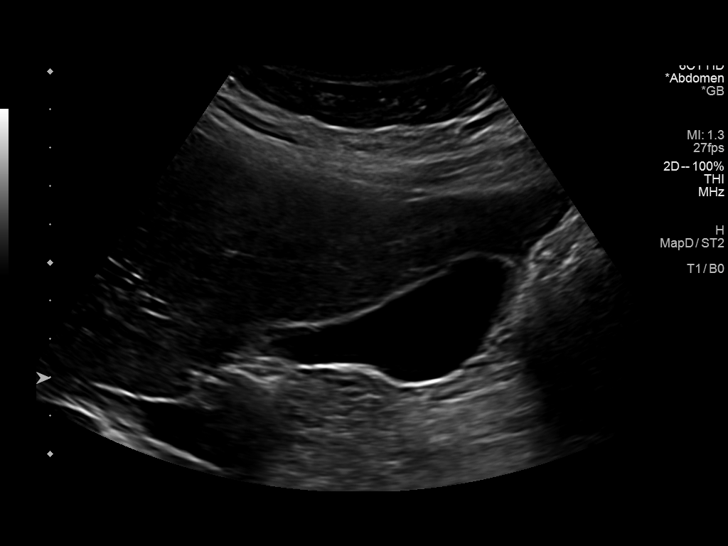
[im 4/41]
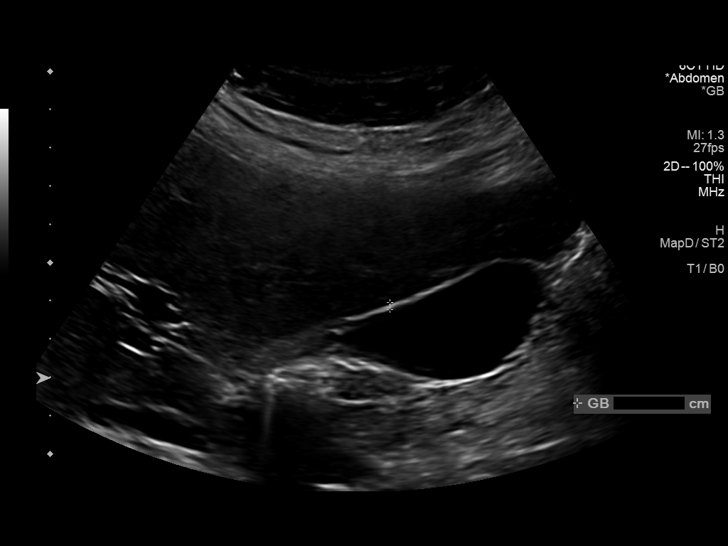
[im 7/41]
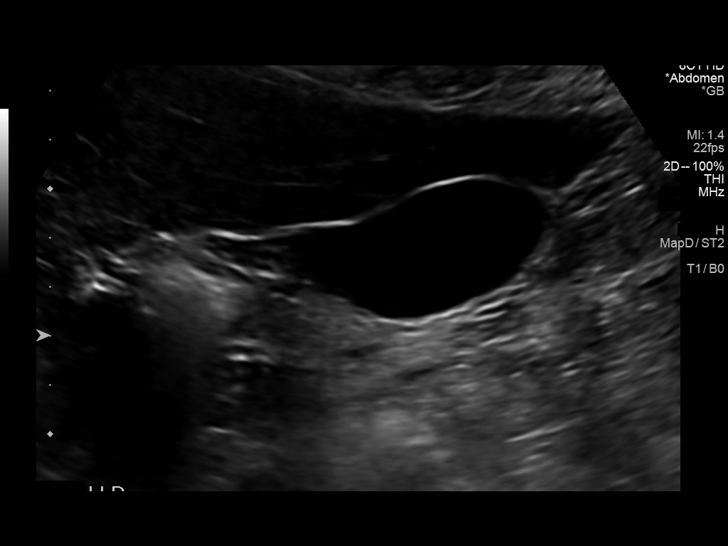
[im 11/41]
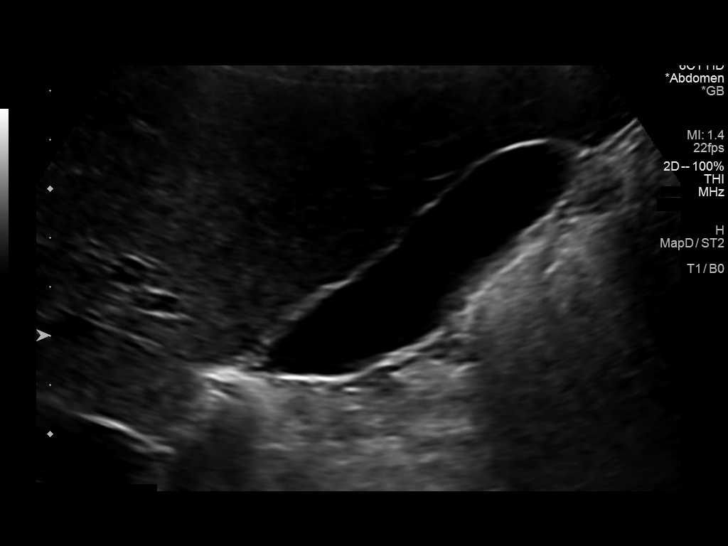
[im 14/41]
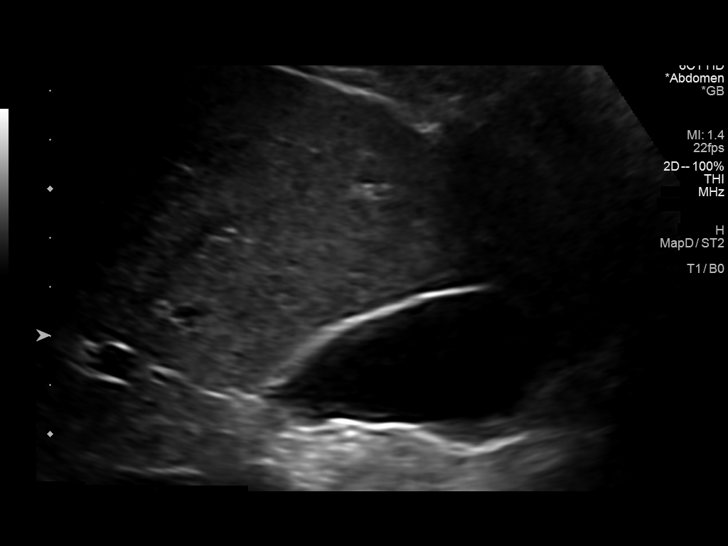
[im 16/41]
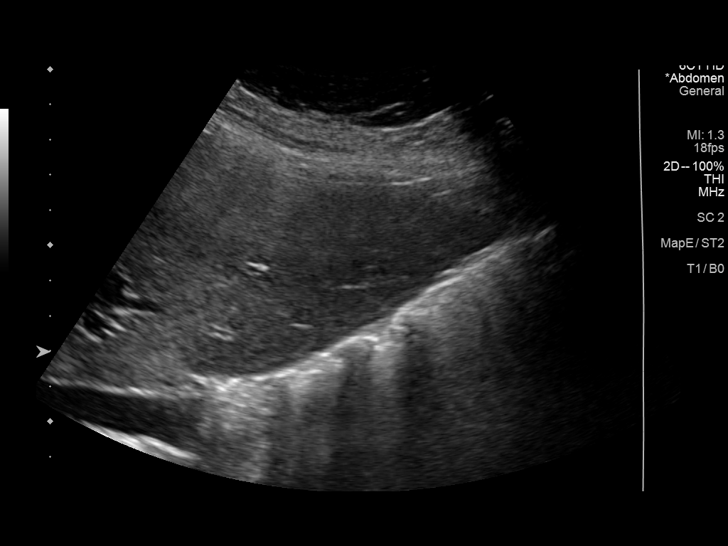
[im 19/41]
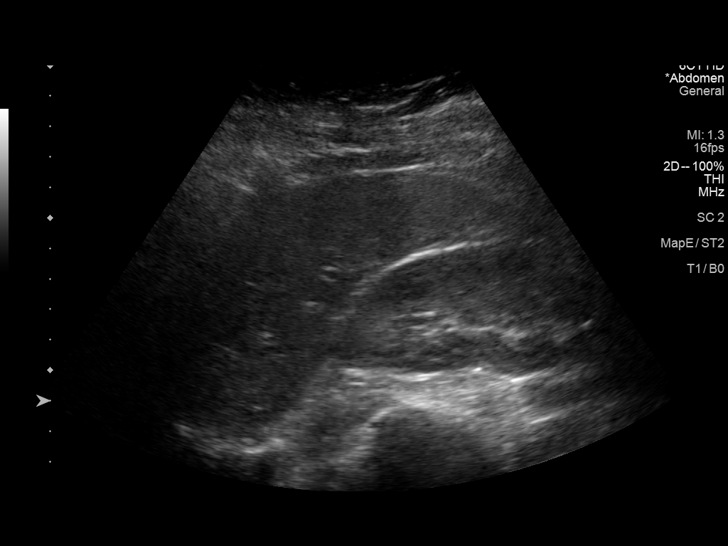
[im 22/41]
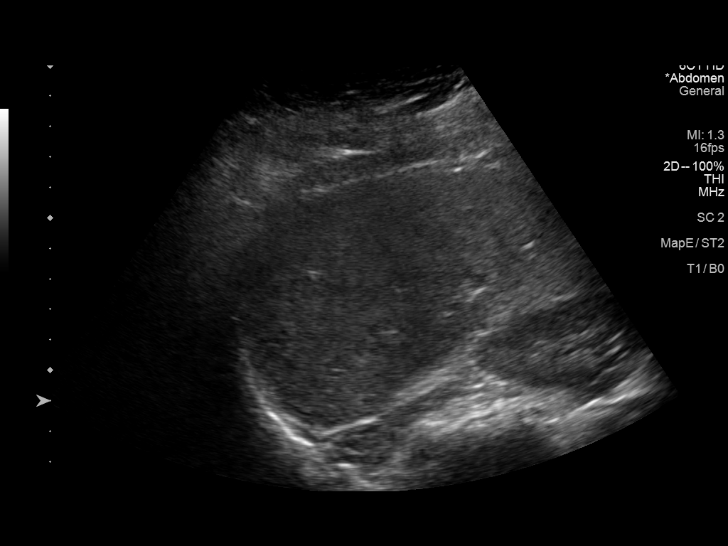
[im 26/41]
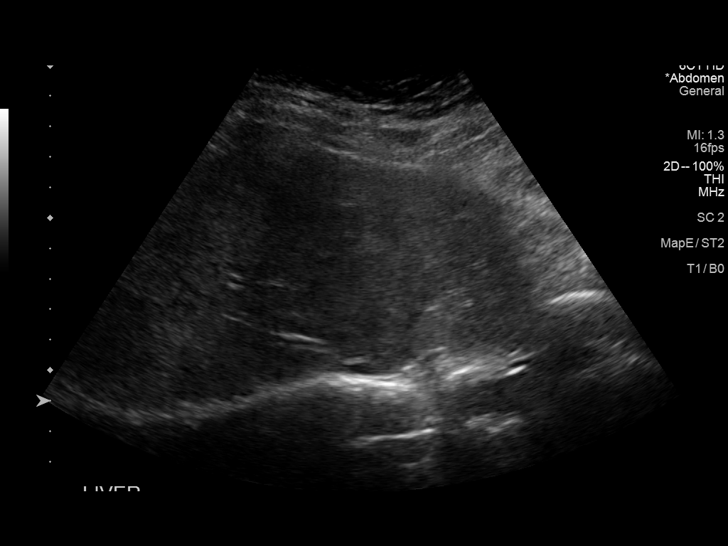
[im 27/41]
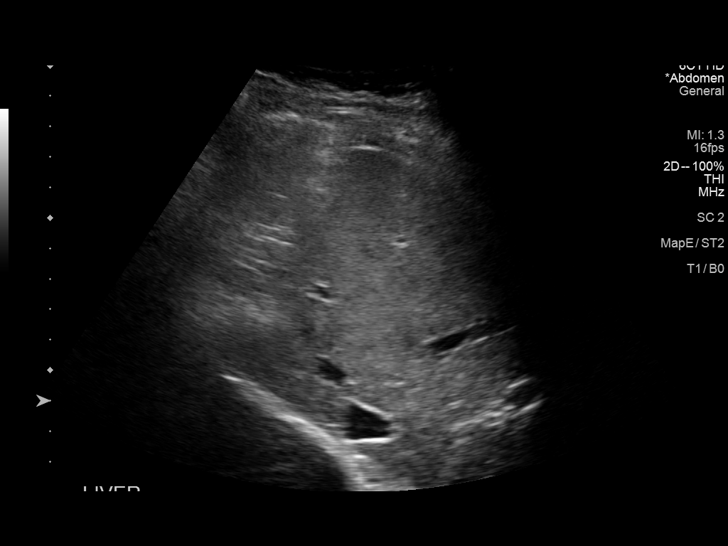
[im 31/41]
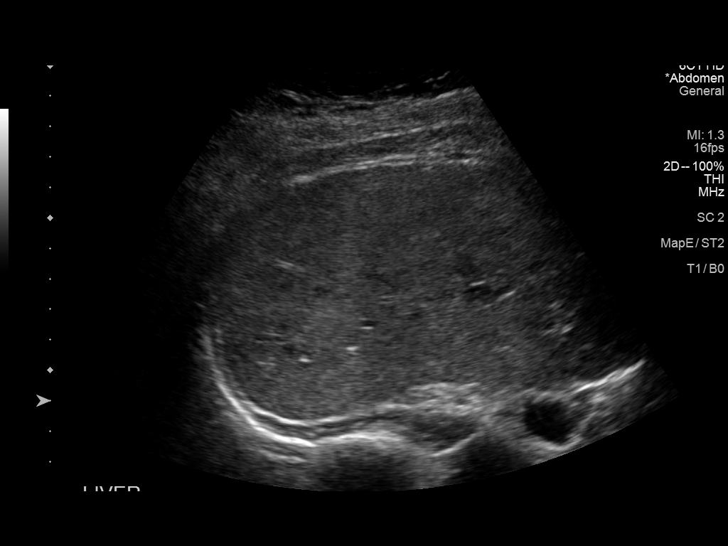
[im 34/41]
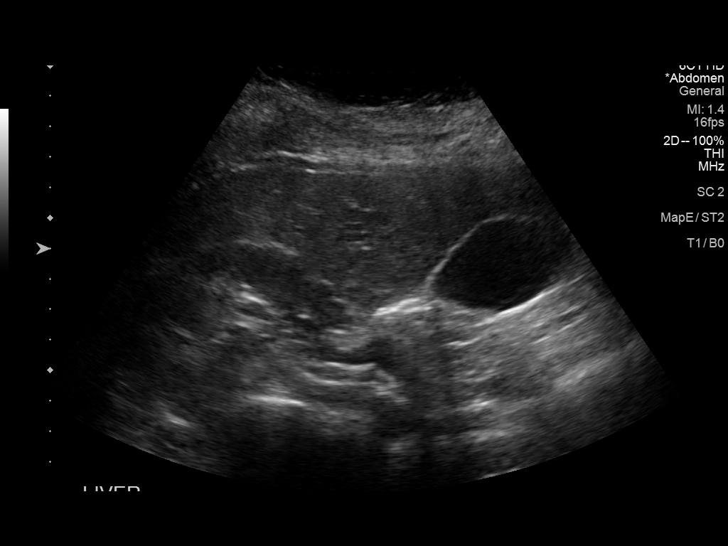
[im 37/41]
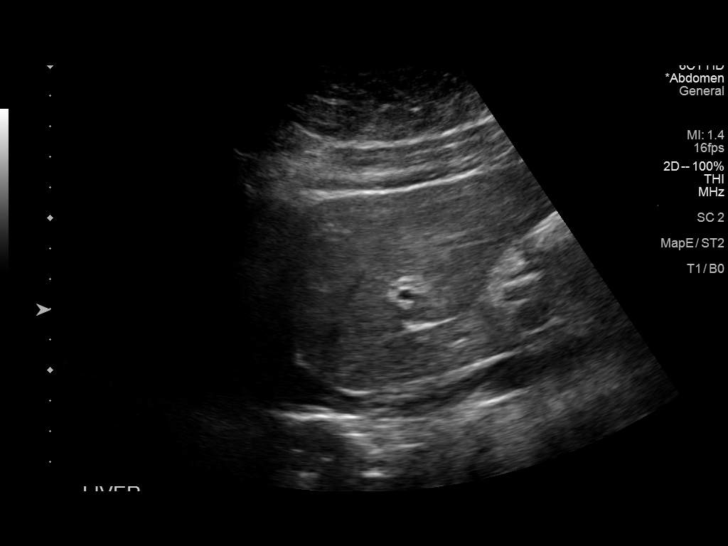
[im 41/41]
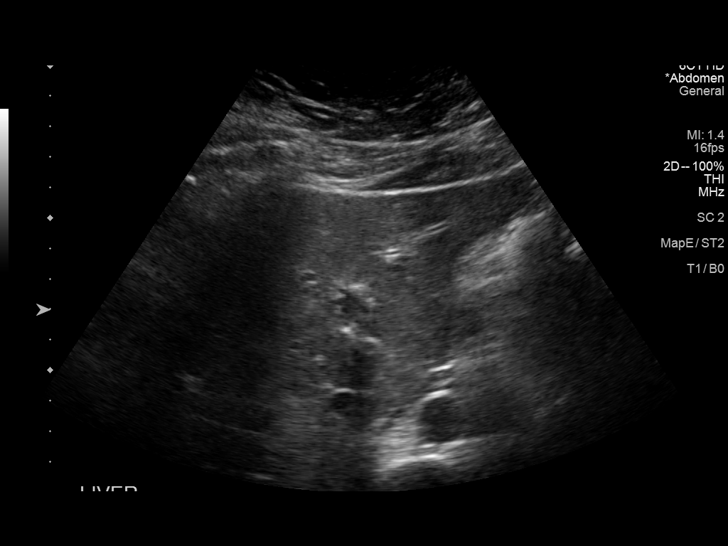

[14 of 25 positions shown; findings below may reference images not displayed]

FINDINGS: Gallbladder:

No gallstones or wall thickening visualized. No sonographic Murphy
sign noted by sonographer.

Common bile duct:

Diameter: 3 mm

Liver:

No focal lesion identified. Within normal limits in parenchymal
echogenicity. Portal vein is patent on color Doppler imaging with
normal direction of blood flow towards the liver.

Other: None.
IMPRESSION: Unremarkable examination.

## 2023-04-05 ENCOUNTER — Ambulatory Visit (INDEPENDENT_AMBULATORY_CARE_PROVIDER_SITE_OTHER): Payer: BC Managed Care – PPO | Admitting: Family Medicine

## 2023-04-05 DIAGNOSIS — Z6841 Body Mass Index (BMI) 40.0 and over, adult: Secondary | ICD-10-CM | POA: Diagnosis not present

## 2023-04-05 MED ORDER — WEGOVY 0.25 MG/0.5ML ~~LOC~~ SOAJ: 0.2500 mg | SUBCUTANEOUS | 0 refills | Status: AC

## 2023-04-05 NOTE — Progress Notes (Signed)
Established Patient Office Visit  Subjective:  Patient ID: Charlene Reynolds, female    DOB: 20-Dec-1995  Age: 27 y.o. MRN: [id number]  CC:  Chief Complaint  Patient presents with   Obesity    3 month f/u, pt reports weight going up and down, pt has cut out snacking and cutting back on soft drinks.     HPI Charlene Reynolds is a 27 y.o. female with past medical history of obesity presents for f/u. For the details of today's visit, please refer to the assessment and plan.     Past Medical History:  Diagnosis Date   Allergic rhinitis    Eczema    Endometriosis    Ovarian cyst bilateral    Past Surgical History:  Procedure Laterality Date   CESAREAN SECTION N/A 06/17/2019   Procedure: CESAREAN SECTION;  Surgeon: Candice Camp, MD;  Location: [facility];  Service: Obstetrics;  Laterality: N/A;   CESAREAN SECTION N/A 08/20/2020   Procedure: CESAREAN SECTION AND RIGHT OVARIAN CYSTECTOMY;  Surgeon: Candice Camp, MD;  Location: [facility];  Service: Obstetrics;  Laterality: N/A;  Repeat edc  08/27/20 need RNFA   WISDOM TOOTH EXTRACTION      Family History  Problem Relation Age of Onset   Asthma Mother    Hypertension Mother    Irritable bowel syndrome Mother    Diabetes Paternal Grandfather    Diabetes Paternal Grandmother    Breast cancer Maternal Aunt    Sarcoidosis Maternal Grandmother     Social History   Socioeconomic History   Marital status: Married    Spouse name: Not on file   Number of children: Not on file   Years of education: Not on file   Highest education level: Bachelor's degree (e.g., BA, AB, BS)  Occupational History   Occupation: health care    Occupation: Conservation officer, nature   Tobacco Use   Smoking status: Former    Types: Cigars   Smokeless tobacco: Never  Building services engineer Use: Never used  Substance and Sexual Activity   Alcohol use: No    Alcohol/week: 0.0 standard drinks of alcohol   Drug use: No   Sexual activity: Yes    Birth  control/protection: I.U.D.  Other Topics Concern   Not on file  Social History Narrative   Not on file   Social Determinants of Health   Financial Resource Strain: Low Risk  (04/05/2023)   Overall Financial Resource Strain (CARDIA)    Difficulty of Paying Living Expenses: Not hard at all  Food Insecurity: No Food Insecurity (04/05/2023)   Hunger Vital Sign    Worried About Running Out of Food in the Last Year: Never true    Ran Out of Food in the Last Year: Never true  Transportation Needs: No Transportation Needs (04/05/2023)   PRAPARE - Administrator, Civil Service (Medical): No    Lack of Transportation (Non-Medical): No  Physical Activity: Insufficiently Active (04/05/2023)   Exercise Vital Sign    Days of Exercise per Week: 3 days    Minutes of Exercise per Session: 30 min  Stress: No Stress Concern Present (04/05/2023)   [employer] of Occupational Health - Occupational Stress Questionnaire    Feeling of Stress : Not at all  Social Connections: Moderately Integrated (04/05/2023)   Social Connection and Isolation Panel [NHANES]    Frequency of Communication with Friends and Family: More than three times a week    Frequency of Social Gatherings with  Friends and Family: Twice a week    Attends Religious Services: 1 to 4 times per year    Active Member of [employer] or Organizations: No    Attends Engineer, structural: Not on file    Marital Status: Married  Catering manager Violence: Not on file    Outpatient Medications Prior to Visit  Medication Sig Dispense Refill   DUPIXENT 300 MG/2ML prefilled syringe Inject 300 mg into the skin every 14 (fourteen) days.     triamcinolone ointment (KENALOG) 0.5 % Apply 1 application  topically 2 (two) times daily. 60 g 0   No facility-administered medications prior to visit.    Allergies  Allergen Reactions   Sulfur Dermatitis, Hives, Itching, Rash, Other (See Comments) and Swelling   Codeine Nausea And Vomiting    Hydrocodone Nausea And Vomiting   Other     Unspecified nuts   Sulfa Antibiotics Hives    ROS Review of Systems  Constitutional:  Negative for chills and fever.  Eyes:  Negative for visual disturbance.  Respiratory:  Negative for chest tightness and shortness of breath.   Neurological:  Negative for dizziness and headaches.      Objective:    Physical Exam HENT:     Head: Normocephalic.     Mouth/Throat:     Mouth: Mucous membranes are moist.  Cardiovascular:     Rate and Rhythm: Normal rate.     Heart sounds: Normal heart sounds.  Pulmonary:     Effort: Pulmonary effort is normal.     Breath sounds: Normal breath sounds.  Neurological:     Mental Status: She is alert.     BP 112/76   Pulse 71   Ht 5' 1.5" (1.562 m)   Wt 244 lb 1.9 oz (110.7 kg)   SpO2 98%   BMI 45.38 kg/m  Wt Readings from Last 3 Encounters:  04/05/23 244 lb 1.9 oz (110.7 kg)  01/04/23 245 lb 1.3 oz (111.2 kg)  11/02/21 200 lb (90.7 kg)    Lab Results  Component Value Date   TSH 0.786 02/04/2023   Lab Results  Component Value Date   WBC 5.5 02/04/2023   HGB 13.5 02/04/2023   HCT 40.2 02/04/2023   MCV 85 02/04/2023   PLT 237 02/04/2023   Lab Results  Component Value Date   NA 137 02/04/2023   K 4.2 02/04/2023   CO2 19 (L) 02/04/2023   GLUCOSE 84 02/04/2023   BUN 12 02/04/2023   CREATININE 0.77 02/04/2023   BILITOT 0.3 02/04/2023   ALKPHOS 72 02/04/2023   AST 17 02/04/2023   ALT 19 02/04/2023   PROT 7.2 02/04/2023   ALBUMIN 4.5 02/04/2023   CALCIUM 9.2 02/04/2023   ANIONGAP 9 11/02/2021   EGFR 108 02/04/2023   Lab Results  Component Value Date   CHOL 211 (H) 02/04/2023   Lab Results  Component Value Date   HDL 58 02/04/2023   Lab Results  Component Value Date   LDLCALC 139 (H) 02/04/2023   Lab Results  Component Value Date   TRIG 80 02/04/2023   Lab Results  Component Value Date   CHOLHDL 3.6 02/04/2023   Lab Results  Component Value Date   HGBA1C 5.7  (H) 02/04/2023      Assessment & Plan:  Morbid obesity (HCC) Assessment & Plan: Reports that she has been implementing lifestyle changes with fluctuation in her weight Patient has lost 1 pound She admits to binge eating Encouraged decreasing her  portion size and increasing her intake of fruits, vegetables, protein and fiber Will start the patient on Wegovy 0.25 mg today All questions answered patient verbalized understanding Encouraged to continue implementing lifestyle changes with increased physical activity and a heart healthy diet Wt Readings from Last 3 Encounters:  04/05/23 244 lb 1.9 oz (110.7 kg)  01/04/23 245 lb 1.3 oz (111.2 kg)  11/02/21 200 lb (90.7 kg)      Orders: -     RUEAVW; Inject 0.25 mg into the skin once a week.  Dispense: 2 mL; Refill: 0    Follow-up: Return in about 1 month (around 05/06/2023) for obesity.   Gilmore Laroche, FNP

## 2023-04-05 NOTE — Assessment & Plan Note (Signed)
Reports that she has been implementing lifestyle changes with fluctuation in her weight Patient has lost 1 pound She admits to binge eating Encouraged decreasing her portion size and increasing her intake of fruits, vegetables, protein and fiber Will start the patient on Wegovy 0.25 mg today All questions answered patient verbalized understanding Encouraged to continue implementing lifestyle changes with increased physical activity and a heart healthy diet Wt Readings from Last 3 Encounters:  04/05/23 244 lb 1.9 oz (110.7 kg)  01/04/23 245 lb 1.3 oz (111.2 kg)  11/02/21 200 lb (90.7 kg)

## 2023-04-05 NOTE — Patient Instructions (Addendum)
I appreciate the opportunity to provide care to you today!    Follow up:1 month  Eat three meals per day at times discussed. Cut out all diet bevergages and drink only water Eat whole food plant based meals Cut out junk food, fast food and processed foods Exercise 150 minutes a week Lose 1-2 lbs per week. Keep a food journal Choose foods that grow in a garden or in a fruit orchard and protein of animals with fins or feathers.  Lifestyle Medicine - Whole Food, Plant Predominant Nutrition is highly recommended: Eat Plenty of vegetables, Mushrooms, fruits, Legumes, Whole Grains, Nuts, seeds in lieu of processed meats, processed snacks/pastries red meat, poultry, eggs.   -It is better to avoid simple carbohydrates including: Cakes, Sweet Desserts, Ice Cream, Soda (diet and regular), Sweet Tea, Candies, Chips, Cookies, Store Bought Juices, Alcohol in Excess of  1-2 drinks a day, Lemonade,  Artificial Sweeteners, Doughnuts, Coffee Creamers, "Sugar-free" Products, etc, etc.  This is not a complete list.....  Exercise: If you are able: 30 -60 minutes a day ,4 days a week, or 150 minutes a week.  The longer the better.  Combine stretch, strength, and aerobic activities.  If you were told in the past that you have high risk for cardiovascular diseases, you may seek evaluation by your heart doctor prior to initiating moderate to intense exercise programs.     Please continue to a heart-healthy diet and increase your physical activities. Try to exercise for at least five days a week.      It was a pleasure to see you and I look forward to continuing to work together on your health and well-being. Please do not hesitate to call the office if you need care or have questions about your care.   Have a wonderful day and week. With Gratitude, Gilmore Laroche MSN, FNP-BC

## 2023-04-06 ENCOUNTER — Telehealth: Payer: Self-pay | Admitting: Family Medicine

## 2023-04-06 NOTE — Telephone Encounter (Signed)
Pt called in requesting call back in regard to pre auth has not heard anything back in regard

## 2023-04-06 NOTE — Telephone Encounter (Signed)
Have not received a PA notification

## 2023-05-06 ENCOUNTER — Ambulatory Visit: Payer: BC Managed Care – PPO | Admitting: Family Medicine

## 2023-06-02 ENCOUNTER — Encounter: Payer: Self-pay | Admitting: Family Medicine

## 2023-07-20 ENCOUNTER — Telehealth: Payer: BC Managed Care – PPO | Admitting: Physician Assistant

## 2023-07-20 ENCOUNTER — Encounter: Payer: Self-pay | Admitting: Physician Assistant

## 2023-07-20 DIAGNOSIS — U071 COVID-19: Secondary | ICD-10-CM | POA: Diagnosis not present

## 2023-07-20 MED ORDER — BENZONATATE 100 MG PO CAPS: 100.0000 mg | ORAL_CAPSULE | Freq: Three times a day (TID) | ORAL | 0 refills | Status: AC | PRN

## 2023-07-20 NOTE — Progress Notes (Signed)
I have spent 5 minutes in review of e-visit questionnaire, review and updating patient chart, medical decision making and response to patient.   William Cody Martin, PA-C    

## 2023-07-20 NOTE — Progress Notes (Signed)
E-Visit  for Positive Covid Test Result   We are sorry you are not feeling well. We are here to help!  You have tested positive for COVID-19, meaning that you were infected with the novel coronavirus and could give the virus to others.  Most people with COVID-19 have mild illness and can recover at home without medical care. Do not leave your home, except to get medical care. Do not visit public areas and do not go to places where you are unable to wear a mask. It is important that you stay home  to take care for yourself and to help protect other people in your home and community.      Isolation Instructions:   You are to isolate at home until you have been fever free for at least 24 hours without a fever-reducing medication, and symptoms have been steadily improving for 24 hours. At that time,  you can end isolation but need to mask for an additional 5 days.  If you must be around other household members who do not have symptoms, you need to make sure that both you and the family members are masking consistently with a high-quality mask.  If you note any worsening of symptoms despite treatment, please seek an in-person evaluation ASAP. If you note any significant shortness of breath or any chest pain, please seek ER evaluation. Please do not delay care!   Go to the nearest hospital ED for assessment if fever/cough/breathlessness are severe or illness seems like a threat to life.    The following symptoms may appear 2-14 days after exposure: Fever Cough Shortness of breath or difficulty breathing Chills Repeated shaking with chills Muscle pain Headache Sore throat New loss of taste or smell Fatigue Congestion or runny nose Nausea or vomiting Diarrhea  You can use medication such as prescription cough medication called Tessalon Perles 100 mg. You may take 1-2 capsules every 8 hours as needed for cough  You may also take acetaminophen (Tylenol) as needed for fever.  I have sent  a work note to Pharmacologist. You can find by going to the Menu on your homepage, scrolling down to the Communications section, and selecting Letters. Let us know if you have any issue locating. Take care and feel better soon!   HOME CARE: Only take medications as instructed by your medical team. Drink plenty of fluids and get plenty of rest. A steam or ultrasonic humidifier can help if you have congestion.   GET HELP RIGHT AWAY IF YOU HAVE EMERGENCY WARNING SIGNS.  Call 911 or proceed to your closest emergency facility if: You develop worsening high fever. Trouble breathing Bluish lips or face Persistent pain or pressure in the chest New confusion Inability to wake or stay awake You cough up blood. Your symptoms become more severe Inability to hold down food or fluids  This list is not all possible symptoms. Contact your medical provider for any symptoms that are severe or concerning to you.   Your e-visit answers were reviewed by a board certified advanced clinical practitioner to complete your personal care plan.  Depending on the condition, your plan could have included both over the counter or prescription medications.  If there is a problem please reply once you have received a response from your provider.  Your safety is important to [place].  If you have drug allergies check your prescription carefully.    You can use MyChart to ask questions about today's visit, request a non-urgent call back,  or ask for a work or school excuse for 24 hours related to this e-Visit. If it has been greater than 24 hours you will need to follow up with your provider, or enter a new e-Visit to address those concerns. You will get an e-mail in the next two days asking about your experience.  I hope that your e-visit has been valuable and will speed your recovery. Thank you for using e-visits.

## 2023-07-29 ENCOUNTER — Ambulatory Visit: Payer: BC Managed Care – PPO | Admitting: Family Medicine

## 2023-08-05 ENCOUNTER — Telehealth (INDEPENDENT_AMBULATORY_CARE_PROVIDER_SITE_OTHER): Payer: BC Managed Care – PPO | Admitting: Family Medicine

## 2023-08-05 ENCOUNTER — Encounter: Payer: Self-pay | Admitting: Family Medicine

## 2023-08-05 VITALS — Ht 61.5 in | Wt 244.2 lb

## 2023-08-05 DIAGNOSIS — M7660 Achilles tendinitis, unspecified leg: Secondary | ICD-10-CM

## 2023-08-05 MED ORDER — NAPROXEN 500 MG PO TABS: 500.0000 mg | ORAL_TABLET | Freq: Two times a day (BID) | ORAL | 0 refills | Status: AC

## 2023-08-05 NOTE — Progress Notes (Signed)
Virtual Visit via Video Note  I connected with Charlene Reynolds on 08/05/23 at 11:40 AM EDT by a video enabled telemedicine application and verified that I am speaking with the correct person using two identifiers.  Patient Location: Home Provider Location: Office/Clinic  I discussed the limitations, risks, security, and privacy concerns of performing an evaluation and management service by video and the availability of in person appointments. I also discussed with the patient that there may be a patient responsible charge related to this service. The patient expressed understanding and agreed to proceed.  Subjective: PCP: Gilmore Laroche, FNP  Chief Complaint  Patient presents with   Foot Pain    Achilles Pain   HPI The patient presents today with complaints of right Achilles pain, which has been ongoing for 2 months but has worsened in the past few weeks. She reports that the pain intensifies with increased activity and describes a popping and burning sensation. She also notes soreness in the Achilles tendon, particularly when ambulating. Her pain is rated 7 out of 10 today. The patient reports increased activity at the gym and states that she is unable to step on her tiptoes due to the pain in her Achilles tendon.   ROS: Per HPI  Current Outpatient Medications:    DUPIXENT 300 MG/2ML prefilled syringe, Inject 300 mg into the skin every 14 (fourteen) days., Disp: , Rfl:    naproxen (NAPROSYN) 500 MG tablet, Take 1 tablet (500 mg total) by mouth 2 (two) times daily with a meal for 10 days., Disp: 20 tablet, Rfl: 0   Semaglutide-Weight Management (WEGOVY) 0.25 MG/0.5ML SOAJ, Inject 0.25 mg into the skin once a week., Disp: 2 mL, Rfl: 0   triamcinolone ointment (KENALOG) 0.5 %, Apply 1 application  topically 2 (two) times daily., Disp: 60 g, Rfl: 0   benzonatate (TESSALON) 100 MG capsule, Take 1 capsule (100 mg total) by mouth 3 (three) times daily as needed for cough. (Patient  not taking: Reported on 08/05/2023), Disp: 30 capsule, Rfl: 0  Observations/Objective: Today's Vitals   08/05/23 1132  Weight: 244 lb 3 oz (110.8 kg)  Height: 5' 1.5" (1.562 m)  PainSc: 7   PainLoc: Foot   Physical Exam No labored breathing.  Speech is clear and coherent with logical content.  Patient is alert and oriented at baseline.   Assessment and Plan: Achilles tendon pain -     Naproxen; Take 1 tablet (500 mg total) by mouth 2 (two) times daily with a meal for 10 days.  Dispense: 20 tablet; Refill: 0 -     DG Ankle Complete Right -     Ambulatory referral to Orthopedic Surgery  The patient is encouraged to rest and apply ice therapy, along with performing stretching and strengthening exercises. Supportive footwear, the use of a heel lift, or braces are recommended to reduce stress on the Achilles tendon. The patient will be treated with naproxen 500 mg twice daily for 10 days. A referral has been placed to orthopedic surgery for further evaluation.  Follow Up Instructions: No follow-ups on file.   I discussed the assessment and treatment plan with the patient. The patient was provided an opportunity to ask questions, and all were answered. The patient agreed with the plan and demonstrated an understanding of the instructions.   The patient was advised to call back or seek an in-person evaluation if the symptoms worsen or if the condition fails to improve as anticipated.  The above assessment and management plan  was discussed with the patient. The patient verbalized understanding of and has agreed to the management plan.   Gilmore Laroche, FNP

## 2023-08-23 ENCOUNTER — Ambulatory Visit (HOSPITAL_COMMUNITY)
Admission: RE | Admit: 2023-08-23 | Discharge: 2023-08-23 | Disposition: A | Discharge status: Home / Self Care | Payer: BC Managed Care – PPO | Source: Ambulatory Visit | Attending: Family Medicine | Admitting: Family Medicine

## 2023-08-23 DIAGNOSIS — M7660 Achilles tendinitis, unspecified leg: Secondary | ICD-10-CM | POA: Insufficient documentation

## 2023-08-31 NOTE — Progress Notes (Signed)
Please inform the patient that her imaging study indicates some swelling and the presence of a calcaneal spur; however, no serious or recent injuries have been detected. I recommend the following non-pharmacological interventions: rest, ice therapy, stretching exercises, and over-the-counter Motrin as needed for pain relief.

## 2023-09-01 NOTE — Progress Notes (Signed)
Please encouraged the patient to follow-up with referral placed to orthopedic surgery

## 2023-09-28 ENCOUNTER — Ambulatory Visit: Payer: BC Managed Care – PPO | Admitting: Orthopedic Surgery

## 2023-09-29 ENCOUNTER — Encounter: Payer: Self-pay | Admitting: Orthopedic Surgery

## 2023-09-29 ENCOUNTER — Ambulatory Visit (INDEPENDENT_AMBULATORY_CARE_PROVIDER_SITE_OTHER): Payer: BC Managed Care – PPO | Admitting: Orthopedic Surgery

## 2023-09-29 VITALS — BP 124/74 | HR 94 | Ht 61.5 in | Wt 235.0 lb

## 2023-09-29 DIAGNOSIS — M7661 Achilles tendinitis, right leg: Secondary | ICD-10-CM | POA: Diagnosis not present

## 2023-09-29 MED ORDER — PREDNISONE 10 MG (21) PO TBPK: ORAL_TABLET | ORAL | 0 refills | Status: DC

## 2023-09-29 NOTE — Patient Instructions (Addendum)
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

## 2023-09-30 ENCOUNTER — Telehealth: Payer: Self-pay | Admitting: Orthopedic Surgery

## 2023-09-30 ENCOUNTER — Encounter: Payer: Self-pay | Admitting: Orthopedic Surgery

## 2023-09-30 NOTE — Telephone Encounter (Signed)
Dr. Dallas Schimke pt - spoke w/the pt, she stated she was seen yesterday and that her job is telling her she either can have light duty or no duty.  They also gave her an Essential Job Function paper that needs to be filled out.  She stated she really needs that asap.  Toniann Fail, can that paper be filled out here or would that go to [employer]?

## 2023-09-30 NOTE — Progress Notes (Signed)
New Patient Visit  Assessment: Charlene Reynolds is a 27 y.o. female with the following: 1. Achilles tendinitis of right lower extremity  Plan: Herma Ard has pain at the insertion of the Achilles in the right ankle.  Pain is worse in the medial aspect of the tendon.  There is no bruising.  There is no swelling.  The tendon is in continuity.  Prior injuries may have resulted in a partial thickness injury to the tendon, but I am not concerned about a complete tear of the Achilles.  At this point, she most likely has some irritation at the insertion of the Achilles.  Will treated as Achilles tendinitis.  I think she would benefit from a period of immobilization in a walking boot, potentially with a heel lift.  I have also provided her with some prednisone.  I would like for her to continue with light duty at work for at least 2 weeks.  She will return to clinic at that time.  She may ultimately require some physical therapy but we can continue to reassess at future visits.  Follow-up: Return in about 2 weeks (around 10/13/2023).  Subjective:  Chief Complaint  Patient presents with   Ankle Pain    Achilles popped Right  / June missed a step     History of Present Illness: Charlene Reynolds is a 27 y.o. female who has been referred by Gilmore Laroche, FNP for evaluation of right heel pain.  She states she for started have some pain in the right heel in June, after falling down stairs.  At that time, there is no bruising, swelling or concern for a worsened injury.  She also had shoulder pain at that time, which was worse than her heel.  Both pains gradually got better.  More recently, she states that she got on her toes to look on top of a shelf, and felt and heard a loud pop.  Since then, she has had a lot more pain in the heel, specifically within the medial aspect of the Achilles.  Following the most recent event, she did not have any bruising, swelling or redness.  She has  tried heel lifts.  She has tried some naproxen.  Minimal improvement in her symptoms so far.  She works with the [employer], and work is getting progressively more difficult.   Review of Systems: No fevers or chills No numbness or tingling No chest pain No shortness of breath No bowel or bladder dysfunction No GI distress No headaches   Medical History:  Past Medical History:  Diagnosis Date   Allergic rhinitis    Eczema    Endometriosis    Ovarian cyst bilateral    Past Surgical History:  Procedure Laterality Date   CESAREAN SECTION N/A 06/17/2019   Procedure: CESAREAN SECTION;  Surgeon: Candice Camp, MD;  Location: [facility];  Service: Obstetrics;  Laterality: N/A;   CESAREAN SECTION N/A 08/20/2020   Procedure: CESAREAN SECTION AND RIGHT OVARIAN CYSTECTOMY;  Surgeon: Candice Camp, MD;  Location: [facility];  Service: Obstetrics;  Laterality: N/A;  Repeat edc  08/27/20 need RNFA   WISDOM TOOTH EXTRACTION      Family History  Problem Relation Age of Onset   Asthma Mother    Hypertension Mother    Irritable bowel syndrome Mother    Diabetes Paternal Grandfather    Diabetes Paternal Grandmother    Breast cancer Maternal Aunt    Sarcoidosis Maternal Grandmother    Social  History   Tobacco Use   Smoking status: Former    Types: Cigars   Smokeless tobacco: Never  Vaping Use   Vaping status: Never Used  Substance Use Topics   Alcohol use: No    Alcohol/week: 0.0 standard drinks of alcohol   Drug use: No    Allergies  Allergen Reactions   Sulfur Dermatitis, Hives, Itching, Rash, Other (See Comments) and Swelling   Codeine Nausea And Vomiting   Hydrocodone Nausea And Vomiting   Other     Unspecified nuts   Sulfa Antibiotics Hives    Current Meds  Medication Sig   benzonatate (TESSALON) 100 MG capsule Take 1 capsule (100 mg total) by mouth 3 (three) times daily as needed for cough.   DUPIXENT 300 MG/2ML prefilled syringe Inject 300  mg into the skin every 14 (fourteen) days.   levonorgestrel (MIRENA, 52 MG,) 20 MCG/DAY IUD    predniSONE (STERAPRED UNI-PAK 21 TAB) 10 MG (21) TBPK tablet 10 mg DS 12 as directed   Semaglutide-Weight Management (WEGOVY) 0.25 MG/0.5ML SOAJ Inject 0.25 mg into the skin once a week.   triamcinolone ointment (KENALOG) 0.5 % Apply 1 application  topically 2 (two) times daily.    Objective: BP 124/74   Pulse 94   Ht 5' 1.5" (1.562 m)   Wt 235 lb (106.6 kg)   BMI 43.68 kg/m   Physical Exam:  General: Alert and oriented. and No acute distress. Gait: Right sided antalgic gait.  Evaluation of the right ankle demonstrates no swelling.  No deformity.  No bruising is appreciated.  The Achilles tendon is palpable and in continuity to its insertion.  Minimal tenderness to palpation along the lateral aspect of the insertion.  At the medial aspect of the insertion, she is exquisitely tender to palpation.  Dorsiflexion beyond 90 degrees is painful.  Toes are warm and well-perfused.  Sensation intact to the dorsum of the foot.  IMAGING: I personally reviewed images previously obtained from the ED  X-rays of the right ankle were previously obtained.  No acute injuries.  No dislocation.  No soft tissue swelling.   New Medications:  Meds ordered this encounter  Medications   predniSONE (STERAPRED UNI-PAK 21 TAB) 10 MG (21) TBPK tablet    Sig: 10 mg DS 12 as directed    Dispense:  48 tablet    Refill:  0      Oliver Barre, MD  09/30/2023 9:26 AM

## 2023-09-30 NOTE — Telephone Encounter (Signed)
Dr. Dallas Schimke pt - Dr. Salena Saner filled out the paperwork for the patient and she picked it up, but she is wanting a revised worked note, below is from her email, please advise:  Also, for the work excusal note from yesterday,may I please have it amended to indicate that I have been recommended for light duty due to having to wear a support boot for two weeks. Dr. Dallas Schimke initially suggested wording it this way, however I did not want to go on light duty at that time as I did not think I would have to. My job has told me otherwise, and told me I must apply for light duty or be out of work while being treated. I do not wish to be out of work so I am trying to get everything situated now to apply for accommodations!

## 2023-09-30 NOTE — Telephone Encounter (Signed)
Ok we worded it how she wanted, to try to get the work she requested to Dr C can you fill out work restriction form?

## 2023-10-13 ENCOUNTER — Ambulatory Visit: Payer: BC Managed Care – PPO | Admitting: Orthopedic Surgery

## 2023-10-15 ENCOUNTER — Ambulatory Visit (INDEPENDENT_AMBULATORY_CARE_PROVIDER_SITE_OTHER): Payer: BC Managed Care – PPO | Admitting: Orthopedic Surgery

## 2023-10-15 ENCOUNTER — Encounter: Payer: Self-pay | Admitting: Orthopedic Surgery

## 2023-10-15 VITALS — BP 110/71 | HR 85 | Ht 61.5 in | Wt 235.0 lb

## 2023-10-15 DIAGNOSIS — M7661 Achilles tendinitis, right leg: Secondary | ICD-10-CM | POA: Diagnosis not present

## 2023-10-15 MED ORDER — DICLOFENAC SODIUM 50 MG PO TBEC: 50.0000 mg | DELAYED_RELEASE_TABLET | Freq: Two times a day (BID) | ORAL | 0 refills | Status: AC

## 2023-10-15 NOTE — Progress Notes (Signed)
Return patient Visit  Assessment: Charlene Reynolds is a 27 y.o. female with the following: 1. Achilles tendinitis of right lower extremity  Plan: Charlene Reynolds is doing much better.  She will complete her course of prednisone today.  After that, I have recommended she continue to take diclofenac as needed.  I provided her with a prescription.  We will also set her up with physical therapy.  She would like to return to work.  If she continues to have difficulty, we can consider a referral to Dr. Shon Baton for further evaluation, and other treatment modalities.  She states her understanding.  Documentation was provided allowing her to return to full duties at work.  Follow-up in 2 months for repeat evaluation.  Follow-up: No follow-ups on file.  Subjective:  Chief Complaint  Patient presents with   Ankle Pain    Feels better Right achilles area     History of Present Illness: Charlene Reynolds is a 27 y.o. female who returns to work for repeat evaluation of right heel pain.  I saw her in clinic 2 weeks ago.  She was placed in a boot.  She has been on light duty at work.  She has been taking prednisone.  She feels much better.  She has not been wearing the boot at night, and has taken some steps at home.  It is not as tender.  It does not cause the same level of pain..   Review of Systems: No fevers or chills No numbness or tingling No chest pain No shortness of breath No bowel or bladder dysfunction No GI distress No headaches   Objective: BP 110/71   Pulse 85   Ht 5' 1.5" (1.562 m)   Wt 235 lb (106.6 kg)   BMI 43.68 kg/m   Physical Exam:  General: Alert and oriented. and No acute distress. Gait: Right sided antalgic gait.  Right ankle no swelling.  Mild deformity at the insertion of the Achilles.  Minimal tenderness in this area.  She tolerates dorsiflexion beyond a neutral position, with much better pain.  No pain with inversion or eversion of the  ankle.  An anterior negative anterior drawer.  There is no redness overlying the Achilles.  IMAGING: I personally reviewed images previously obtained from the ED  X-rays of the right ankle were previously obtained.  No acute injuries.  No dislocation.  No soft tissue swelling.   New Medications:  No orders of the defined types were placed in this encounter.     Oliver Barre, MD  10/15/2023 11:14 AM

## 2023-10-27 ENCOUNTER — Other Ambulatory Visit: Payer: Self-pay | Admitting: Family Medicine

## 2023-10-27 ENCOUNTER — Encounter: Payer: Self-pay | Admitting: Family Medicine

## 2023-10-27 DIAGNOSIS — W57XXXA Bitten or stung by nonvenomous insect and other nonvenomous arthropods, initial encounter: Secondary | ICD-10-CM

## 2023-10-27 MED ORDER — TRIAMCINOLONE ACETONIDE 0.5 % EX OINT: 1.0000 | TOPICAL_OINTMENT | Freq: Two times a day (BID) | CUTANEOUS | 0 refills | Status: AC

## 2023-12-15 ENCOUNTER — Ambulatory Visit: Payer: BC Managed Care – PPO | Admitting: Orthopedic Surgery
